# Patient Record
Sex: Male | Born: 1935 | Race: Black or African American | Hispanic: No | Marital: Married | Smoking: Never smoker
Health system: Southern US, Community
[De-identification: ages and names within clinical notes are randomized; demographics above are authoritative.]

## PROBLEM LIST (undated history)

## (undated) DIAGNOSIS — L989 Disorder of the skin and subcutaneous tissue, unspecified: Secondary | ICD-10-CM

## (undated) DIAGNOSIS — H269 Unspecified cataract: Secondary | ICD-10-CM

## (undated) DIAGNOSIS — H35039 Hypertensive retinopathy, unspecified eye: Secondary | ICD-10-CM

## (undated) DIAGNOSIS — I4891 Unspecified atrial fibrillation: Secondary | ICD-10-CM

## (undated) HISTORY — PX: EYE SURGERY: SHX253

## (undated) HISTORY — PX: NECK SURGERY: SHX720

## (undated) HISTORY — PX: CATARACT EXTRACTION: SUR2

## (undated) HISTORY — PX: PROSTATE SURGERY: SHX751

## (undated) HISTORY — DX: Unspecified cataract: H26.9

## (undated) HISTORY — DX: Hypertensive retinopathy, unspecified eye: H35.039

---

## 2015-02-08 ENCOUNTER — Encounter (HOSPITAL_BASED_OUTPATIENT_CLINIC_OR_DEPARTMENT_OTHER): Payer: Self-pay | Admitting: *Deleted

## 2015-02-08 ENCOUNTER — Other Ambulatory Visit: Payer: Self-pay | Admitting: Otolaryngology

## 2015-02-08 NOTE — H&P (Signed)
PREOPERATIVE H&P  Chief Complaint: enlarging posterior scalp lesion  HPI: Daniel Nichols is a 81 y.o. male who presents for evaluation of an enlarging posterior scalp lesion that he's had for years. But it's gradually gotten larger and bothers him. On exam he has a soft 4-5 cm mass on the posterior scalp that may represent a lipoma or cyst. He's taken to the OR for excision under local anesthesia.  Past Medical History  Diagnosis Date  . Scalp lesion    Past Surgical History  Procedure Laterality Date  . Prostate surgery    . Neck surgery Left    History   Social History  . Marital Status: Married    Spouse Name: N/A  . Number of Children: N/A  . Years of Education: N/A   Social History Main Topics  . Smoking status: Never Smoker   . Smokeless tobacco: Not on file  . Alcohol Use: No  . Drug Use: No  . Sexual Activity: Not on file   Other Topics Concern  . None   Social History Narrative  . None   History reviewed. No pertinent family history. No Known Allergies Prior to Admission medications   Medication Sig Start Date End Date Taking? Authorizing Provider  Multiple Vitamin (MULTIVITAMIN WITH MINERALS) TABS tablet Take 1 tablet by mouth daily.   Yes Historical Provider, MD     Positive ROS: negative  All other systems have been reviewed and were otherwise negative with the exception of those mentioned in the HPI and as above.  Physical Exam: There were no vitals filed for this visit.  General: Alert, no acute distress Oral: Normal oral mucosa and tonsils Nasal: Clear nasal passages Neck: No palpable adenopathy or thyroid nodules. Soft posterior scalp lesion 4-5 cm in size. Ear: Ear canal is clear with normal appearing TMs Cardiovascular: Regular rate and rhythm, no murmur.  Respiratory: Clear to auscultation Neurologic: Alert and oriented x 3   Assessment/Plan: SCALP LESION  Plan for Procedure(s): EXCISION SCALP LESION    (MINOR ROOM)    Melony Overly, MD 02/08/2015 4:30 PM

## 2015-02-12 ENCOUNTER — Encounter (HOSPITAL_BASED_OUTPATIENT_CLINIC_OR_DEPARTMENT_OTHER): Payer: Self-pay | Admitting: *Deleted

## 2015-02-12 ENCOUNTER — Ambulatory Visit (HOSPITAL_BASED_OUTPATIENT_CLINIC_OR_DEPARTMENT_OTHER)
Admission: RE | Admit: 2015-02-12 | Discharge: 2015-02-12 | Disposition: A | Discharge status: Home / Self Care | Payer: Medicare Other | Source: Ambulatory Visit | Attending: Otolaryngology | Admitting: Otolaryngology

## 2015-02-12 ENCOUNTER — Encounter (HOSPITAL_BASED_OUTPATIENT_CLINIC_OR_DEPARTMENT_OTHER)
Admission: RE | Disposition: A | Discharge status: Home / Self Care | Payer: Self-pay | Source: Ambulatory Visit | Attending: Otolaryngology

## 2015-02-12 DIAGNOSIS — Z79899 Other long term (current) drug therapy: Secondary | ICD-10-CM | POA: Insufficient documentation

## 2015-02-12 DIAGNOSIS — D170 Benign lipomatous neoplasm of skin and subcutaneous tissue of head, face and neck: Secondary | ICD-10-CM | POA: Diagnosis not present

## 2015-02-12 DIAGNOSIS — L988 Other specified disorders of the skin and subcutaneous tissue: Secondary | ICD-10-CM | POA: Diagnosis present

## 2015-02-12 HISTORY — DX: Disorder of the skin and subcutaneous tissue, unspecified: L98.9

## 2015-02-12 HISTORY — PX: LESION REMOVAL: SHX5196

## 2015-02-12 SURGERY — MINOR EXCISION OF LESION
Anesthesia: LOCAL | Site: Scalp

## 2015-02-12 MED ORDER — BACITRACIN ZINC 500 UNIT/GM EX OINT
TOPICAL_OINTMENT | CUTANEOUS | Status: AC
  Filled 2015-02-12: qty 0.9

## 2015-02-12 MED ORDER — LIDOCAINE-EPINEPHRINE 1 %-1:100000 IJ SOLN
INTRAMUSCULAR | Status: AC
  Filled 2015-02-12: qty 1

## 2015-02-12 MED ORDER — BACITRACIN ZINC 500 UNIT/GM EX OINT
TOPICAL_OINTMENT | CUTANEOUS | Status: AC
  Filled 2015-02-12: qty 28.35

## 2015-02-12 MED ORDER — CEPHALEXIN 500 MG PO CAPS: 500.0000 mg | ORAL_CAPSULE | Freq: Two times a day (BID) | ORAL | Status: DC

## 2015-02-12 MED ORDER — HYDROCODONE-ACETAMINOPHEN 5-325 MG PO TABS: 1.0000 | ORAL_TABLET | ORAL | Status: DC | PRN

## 2015-02-12 MED ORDER — BACITRACIN ZINC 500 UNIT/GM EX OINT
TOPICAL_OINTMENT | CUTANEOUS | Status: DC | PRN
  Administered 2015-02-12: 1 via TOPICAL

## 2015-02-12 MED ORDER — LIDOCAINE-EPINEPHRINE 1 %-1:100000 IJ SOLN
INTRAMUSCULAR | Status: DC | PRN
  Administered 2015-02-12: 9 mL

## 2015-02-12 SURGICAL SUPPLY — 53 items
BANDAGE ADH SHEER 1  50/CT (GAUZE/BANDAGES/DRESSINGS) IMPLANT
BENZOIN TINCTURE PRP APPL 2/3 (GAUZE/BANDAGES/DRESSINGS) IMPLANT
BLADE CLIPPER SURG (BLADE) ×2 IMPLANT
BLADE SURG 15 STRL LF DISP TIS (BLADE) ×1 IMPLANT
BLADE SURG 15 STRL SS (BLADE) ×1
BNDG CONFORM 3 STRL LF (GAUZE/BANDAGES/DRESSINGS) ×2 IMPLANT
BNDG GAUZE ELAST 4 BULKY (GAUZE/BANDAGES/DRESSINGS) ×2 IMPLANT
CANISTER SUCT 1200ML W/VALVE (MISCELLANEOUS) ×2 IMPLANT
CAUTERY EYE LOW TEMP 1300F FIN (OPHTHALMIC RELATED) IMPLANT
CLEANER CAUTERY TIP 5X5 PAD (MISCELLANEOUS) IMPLANT
COTTONBALL LRG STERILE PKG (GAUZE/BANDAGES/DRESSINGS) IMPLANT
DECANTER SPIKE VIAL GLASS SM (MISCELLANEOUS) IMPLANT
DRSG TEGADERM 4X4.75 (GAUZE/BANDAGES/DRESSINGS) IMPLANT
ELECT COATED BLADE 2.86 ST (ELECTRODE) ×2 IMPLANT
ELECT REM PT RETURN 9FT ADLT (ELECTROSURGICAL) ×2
ELECTRODE REM PT RTRN 9FT ADLT (ELECTROSURGICAL) ×1 IMPLANT
GAUZE SPONGE 4X4 16PLY XRAY LF (GAUZE/BANDAGES/DRESSINGS) ×2 IMPLANT
GLOVE BIO SURGEON STRL SZ7 (GLOVE) ×2 IMPLANT
GLOVE SS BIOGEL STRL SZ 7.5 (GLOVE) ×1 IMPLANT
GLOVE SUPERSENSE BIOGEL SZ 7.5 (GLOVE) ×1
GOWN STRL REUS W/ TWL LRG LVL3 (GOWN DISPOSABLE) IMPLANT
GOWN STRL REUS W/TWL LRG LVL3 (GOWN DISPOSABLE)
LIQUID BAND (GAUZE/BANDAGES/DRESSINGS) IMPLANT
MARKER SKIN DUAL TIP RULER LAB (MISCELLANEOUS) ×2 IMPLANT
NEEDLE PRECISIONGLIDE 27X1.5 (NEEDLE) ×2 IMPLANT
NS IRRIG 1000ML POUR BTL (IV SOLUTION) IMPLANT
PACK BASIN DAY SURGERY FS (CUSTOM PROCEDURE TRAY) IMPLANT
PAD CLEANER CAUTERY TIP 5X5 (MISCELLANEOUS)
PENCIL BUTTON HOLSTER BLD 10FT (ELECTRODE) ×2 IMPLANT
SPONGE GAUZE 4X4 12PLY STER LF (GAUZE/BANDAGES/DRESSINGS) ×4 IMPLANT
SPONGE INTESTINAL PEANUT (DISPOSABLE) IMPLANT
STRIP CLOSURE SKIN 1/2X4 (GAUZE/BANDAGES/DRESSINGS) IMPLANT
STRIP CLOSURE SKIN 1/4X4 (GAUZE/BANDAGES/DRESSINGS) IMPLANT
SUCTION FRAZIER TIP 10 FR DISP (SUCTIONS) IMPLANT
SUT CHROMIC 3 0 PS 2 (SUTURE) IMPLANT
SUT CHROMIC 4 0 P 3 18 (SUTURE) IMPLANT
SUT ETHILON 4 0 PS 2 18 (SUTURE) ×2 IMPLANT
SUT ETHILON 5 0 P 3 18 (SUTURE)
SUT ETHILON 6 0 P 1 (SUTURE) IMPLANT
SUT NYLON ETHILON 5-0 P-3 1X18 (SUTURE) IMPLANT
SUT PLAIN 5 0 P 3 18 (SUTURE) IMPLANT
SUT SILK 4 0 TIES 17X18 (SUTURE) IMPLANT
SUT VIC AB 4-0 P-3 18XBRD (SUTURE) ×1 IMPLANT
SUT VIC AB 4-0 P3 18 (SUTURE) ×1
SWAB COLLECTION DEVICE MRSA (MISCELLANEOUS) IMPLANT
SWABSTICK POVIDONE IODINE SNGL (MISCELLANEOUS) IMPLANT
SYR BULB 3OZ (MISCELLANEOUS) IMPLANT
SYR CONTROL 10ML LL (SYRINGE) ×2 IMPLANT
TOWEL OR 17X24 6PK STRL BLUE (TOWEL DISPOSABLE) ×2 IMPLANT
TRAY DSU PREP LF (CUSTOM PROCEDURE TRAY) ×2 IMPLANT
TUBE ANAEROBIC SPECIMEN COL (MISCELLANEOUS) IMPLANT
TUBE CONNECTING 20X1/4 (TUBING) ×2 IMPLANT
YANKAUER SUCT BULB TIP NO VENT (SUCTIONS) IMPLANT

## 2015-02-12 NOTE — Interval H&P Note (Signed)
History and Physical Interval Note:  02/12/2015 8:25 AM  Daniel Nichols  has presented today for surgery, with the diagnosis of SCALP LESION   The various methods of treatment have been discussed with the patient and family. After consideration of risks, benefits and other options for treatment, the patient has consented to  Procedure(s): EXCISION SCALP LESION    (MINOR ROOM)  (N/A) as a surgical intervention .  The patient's history has been reviewed, patient examined, no change in status, stable for surgery.  I have reviewed the patient's chart and labs.  Questions were answered to the patient's satisfaction.     NEWMAN, CHRISTOPHER

## 2015-02-12 NOTE — Discharge Instructions (Signed)
Take your regular meds Tylenol, motrin or hydrocodone prn pain Remove the head dressing tomorrow am and OK to wash hair Call Dr Pollie Friar office for follow up appt in 7-8 days   [phone]

## 2015-02-12 NOTE — Brief Op Note (Signed)
02/12/2015  11:40 AM  PATIENT:  Daniel Nichols  81 y.o. male  PRE-OPERATIVE DIAGNOSIS:  SCALP LESION   POST-OPERATIVE DIAGNOSIS:  Posterior scalp lesion  PROCEDURE:  Procedure(s) with comments: EXCISION SCALP LESION    (MINOR ROOM)  (N/A) - Posterior scalp  SURGEON:  Surgeon(s) and Role:    * Rozetta Nunnery, MD - Primary  PHYSICIAN ASSISTANT:   ASSISTANTS: none   ANESTHESIA:   local  EBL:     BLOOD ADMINISTERED:none  DRAINS: none   LOCAL MEDICATIONS USED:  LIDOCAINE with EPI  9 cc  SPECIMEN:  Source of Specimen:  posterior scalp  DISPOSITION OF SPECIMEN:  PATHOLOGY  COUNTS:  YES  TOURNIQUET:  * No tourniquets in log *  DICTATION: .Other Dictation: Dictation Number [id number]  PLAN OF CARE: Discharge to home after PACU  PATIENT DISPOSITION:  PACU - hemodynamically stable.   Delay start of Pharmacological VTE agent (>24hrs) due to surgical blood loss or risk of bleeding: not applicable

## 2015-02-13 ENCOUNTER — Encounter (HOSPITAL_BASED_OUTPATIENT_CLINIC_OR_DEPARTMENT_OTHER): Payer: Self-pay | Admitting: Otolaryngology

## 2015-02-13 NOTE — Op Note (Signed)
NAMEASHDON, GILLSON NO.:  [id number]  MEDICAL RECORD NO.:  [id number]  LOCATION:                               FACILITY:  [facility]  PHYSICIAN:  Leonides Sake. Lucia Gaskins, M.D.DATE OF BIRTH:  1927-11-20  DATE OF PROCEDURE:  02/12/2015 DATE OF DISCHARGE:  02/12/2015                              OPERATIVE REPORT   PREOPERATIVE DIAGNOSIS:  Enlarging posterior scalp, subcutaneous lesion.  POSTOPERATIVE DIAGNOSIS:  Enlarging posterior scalp, subcutaneous lesion, findings consistent with lipoma.  OPERATION:  Excision of posterior scalp lesion 4 x 5 cm size.  SURGEON:  Leonides Sake. Lucia Gaskins, MD  ANESTHESIA:  Local 1% Xylocaine with epinephrine, 9 mL.  COMPLICATIONS:  None.  BRIEF CLINICAL NOTE:  Daniel Nichols is an 81 year old gentleman who has had a lesion on the posterior scalp now for about 5 years.  He has noticed that it has gradually gotten larger.  On exam, he has a soft about a 4-5 cm subcutaneous mass that is raised about 2-2.5 cm on the posterior right scalp area.  This has bothered him, caused some discomfort recently, and has gradually found to be larger.  He is little bit concerned about the lesion.  On clinical exam, this is more consistent with probable lipoma.  He was taken to the operating room at this time for excision of the posterior scalp lesion.  DESCRIPTION OF PROCEDURE:  The patient was brought to the operating room, placed in a prone position.  The hair over the lesion was shaved. Area was prepped with Betadine solution and draped in a sterile towels. He was then injected with approximately 9 mL of 1% Xylocaine with epinephrine for hemostasis and local anesthetic.  A horizontal incision was made directly over the mass.  Dissection was carried down through the dermis and subcutaneous tissue and then a fatty tumor was encountered that was lying against the periosteum of the bone.  The entire lipoma was removed.  Hemostasis was obtained with  cautery.  Wound was irrigated with saline and closed with 4-0 Vicryl suture subcutaneously and 4-0 nylon to reapproximate the skin edges. Bacitracin ointment and pressure dressing was applied.  The patient tolerated the procedure well with minimal discomfort.  He is discharged home later this morning on Keflex 500 mg b.i.d. for 5 days; Tylenol, Motrin, or Vicodin p.r.n. pain and will follow up in my office in 1 week for recheck and to have sutures removed and review final pathology.    ______________________________ Leonides Sake Lucia Gaskins, M.D.   ______________________________ Leonides Sake. Lucia Gaskins, M.D.    CEN/MEDQ  D:  02/12/2015  T:  02/13/2015  Job:  [id number]

## 2018-05-19 NOTE — Progress Notes (Addendum)
[facility] Note  05/20/2018     CHIEF COMPLAINT Patient presents for Retina Evaluation   HISTORY OF PRESENT ILLNESS: Steele Sizer Sr. is a 84 y.o. male who presents to the clinic today for:   HPI    Retina Evaluation    In left eye.  This started 2 days ago.  Associated Symptoms Distortion.  Negative for Flashes, Pain, Photophobia, Trauma, Jaw Claudication, Fever, Fatigue, Weight Loss, Shoulder/Hip pain, Scalp Tenderness, Glare, Redness, Blind Spot and Floaters.  Context:  distance vision, mid-range vision and near vision.  Response to treatment was no improvement.  I, the attending physician,  performed the HPI with the patient and updated documentation appropriately.          Comments    Patient states sudden loss of vision in what patient thinks is OS but unsure. Top half of vision gone at one point and later left side of vision was affected. This happened about 2 days ago. Vision seems better today. Saw blue triangles in vision last pm. Patient denies nausea, vomiting or headache. Patient denies history of migraines. BP checked with Dr. Jerline Pain yesterday. Reading of 165/98.        Last edited by Bernarda Caffey, MD on 05/20/2018 11:50 AM. (History)    Pt was seen by Dr. Jerline Pain yesterday due to having a sudden problem with VA; Pt states OS VA has been decreasing gradually; Pt states on Saturday (x 6 days) he then began to see floaters OS; Pt states he has poor VA OD due to a cataract; Pt endorses A-Fib, and pacemaker in place; Pt denies any recent changes in heart health; Pt denies ever having heart attack, denies ever having stroke; Pt denies ever having carotid arteries checked; Pt states he has an appointment with cardiologist (Dr. Otho Perl) on Monday and PCP (Dr. Nilda Simmer) on Tuesday of next week;   Referring physician: Shawnie Dapper, DO [address] [place], [place] [address]  HISTORICAL INFORMATION:   Selected notes from the medical  record:  Referred by Dr. Sandre Kitty for concern of CRVO OS LEE: 09.19.19 Marigene Ehlers) [BCVA: OD: CF_0 ' OS: 20/30+2] Ocular Hx-cataract OD, pseudo OS, CRVO OS, HTN ret OU, Bell's Palsy (left side), visual field defect (localized, superior) PMH-heart disease, pacemaker    CURRENT MEDICATIONS: No current outpatient medications on file. (Ophthalmic Drugs)   No current facility-administered medications for this visit.  (Ophthalmic Drugs)   Current Outpatient Medications (Other)  Medication Sig  . metoprolol succinate (TOPROL-XL) 50 MG 24 hr tablet Take 50 mg by mouth daily. Take with or immediately following a meal.  . Multiple Vitamin (MULTIVITAMIN WITH MINERALS) TABS tablet Take 1 tablet by mouth daily.  . cephALEXin (KEFLEX) 500 MG capsule Take 1 capsule (500 mg total) by mouth 2 (two) times daily. (Patient not taking: Reported on 05/20/2018)  . Ginkgo Biloba 40 MG TABS Take by mouth.  Marland Kitchen HYDROcodone-acetaminophen (NORCO/VICODIN) 5-325 MG per tablet Take 1 tablet by mouth every 4 (four) hours as needed for moderate pain. (Patient not taking: Reported on 05/20/2018)  . MAGNESIUM-OXIDE PO Take by mouth.   No current facility-administered medications for this visit.  (Other)      REVIEW OF SYSTEMS: ROS    Positive for: Skin, Musculoskeletal, HENT, Cardiovascular, Eyes   Negative for: Constitutional, Gastrointestinal, Neurological, Genitourinary, Endocrine, Respiratory, Psychiatric, Allergic/Imm, Heme/Lymph   Last edited by Roselee Nova D on 05/20/2018  9:50 AM. (History)  ALLERGIES Allergies  Allergen Reactions  . Codeine Other (See Comments)  . Levaquin [Levofloxacin In D5w] Other (See Comments)    "muscles get weak"  . Phenergan [Promethazine Hcl] Other (See Comments)    PAST MEDICAL HISTORY Past Medical History:  Diagnosis Date  . Atrial fibrillation ([place])   . Cataract   . Scalp lesion    Past Surgical History:  Procedure Laterality Date  . CATARACT EXTRACTION  Left 2008  . LESION REMOVAL N/A 02/12/2015   Procedure: EXCISION SCALP LESION    (MINOR ROOM) ;  Surgeon: Rozetta Nunnery, MD;  Location: [facility];  Service: ENT;  Laterality: N/A;  Posterior scalp  . NECK SURGERY Left   . PROSTATE SURGERY      FAMILY HISTORY Family History  Problem Relation Age of Onset  . Glaucoma Sister   . Glaucoma Brother     SOCIAL HISTORY Social History   Tobacco Use  . Smoking status: Never Smoker  Substance Use Topics  . Alcohol use: No  . Drug use: No         OPHTHALMIC EXAM:  Base Eye Exam    Visual Acuity (Snellen - Linear)      Right Left   Dist [place] 20/350 20/40 +2   Dist ph [place] NI NI       Tonometry (Tonopen, 10:07 AM)      Right Left   Pressure 20 16       Pupils      Dark Light Shape React APD   Right 3 2 Round Brisk None   Left 2 1 Round Brisk None       Visual Fields      Left Right   Restrictions Total superior nasal deficiency Partial outer superior temporal deficiency  Difficulty evaluating OD       Extraocular Movement      Right Left    Full, Ortho Full, Ortho       Neuro/Psych    Oriented x3:  Yes   Mood/Affect:  Normal       Dilation    Both eyes:  1.0% Mydriacyl, 2.5% Phenylephrine @ 10:07 AM        Slit Lamp and Fundus Exam    Slit Lamp Exam      Right Left   Lids/Lashes Dermatochalasis - upper lid, Telangiectasia, Meibomian gland dysfunction, marginal lesion centrally Dermatochalasis - upper lid, Meibomian gland dysfunction, Telangiectasia   Conjunctiva/Sclera White and quiet White and quiet   Cornea Arcus, 1+ Punctate epithelial erosions Arcus, 2+ Punctate epithelial erosions   Anterior Chamber Narrow angles Deep and quiet   Iris Round and moderately dilated Round and moderately dilated, Transillumination defects at 1200 and 0800   Lens 3-4+ Nuclear sclerosis with brunescence , 3+ Cortical cataract Posterior chamber intraocular lens   Vitreous  Vitreous syneresis, Posterior  vitreous detachment       Fundus Exam      Right Left   Disc Hazy view, Peripapillary atrophy hyperemic; blurred margin   C/D Ratio 0.3    Macula scattered IRH superior macula and along ST arcades Blunted foveal reflex, sectoral retinal whitening nasal to fovea, tortuous vessels, DBH   Vessels hazy view; tortuous Tortuous   Periphery attached; scattered heme superiorly attached; scattered DBH   Poor view in right eye        Refraction    Manifest Refraction      Sphere Cylinder Axis Dist VA   Right -2.25 +3.75 005 <  20/200   Left -0.75 +0.50 013 20/30  Couldn't see letters in [place] OD. Only goes to 20/200. Dark reflex OD, unable to retinoscope.           IMAGING AND PROCEDURES  Imaging and Procedures for _0 @  I-stat troponin, ED     Component Value Flag Ref Range Units Status   Troponin i, poc 0.01    0.00 - 0.08 ng/mL Final   Comment 3             Final   Comment:   Due to the release kinetics of cTnI, a negative result within the first hours of the onset of symptoms does not rule out myocardial infarction with certainty. If myocardial infarction is still suspected, repeat the test at appropriate intervals.         OCT, Retina - OU - Both Eyes       Right Eye Quality was good. Central Foveal Thickness: 429. Progression has no prior data. Findings include abnormal foveal contour, intraretinal fluid, no SRF, epiretinal membrane.   Left Eye Quality was good. Central Foveal Thickness: 277. Progression has no prior data. Findings include intraretinal hyper-reflective material, normal foveal contour, no IRF, no SRF (IRHM nasal to fovea).   Notes *Images captured and stored on drive  Diagnosis / Impression:  OD: Superior BRVO with CME OS: NFP with focal IRHM nasal to fovea - BRAO  Clinical management:  See below  Abbreviations: NFP - Normal foveal profile. CME - cystoid macular edema. PED - pigment epithelial detachment. IRF - intraretinal fluid. SRF -  subretinal fluid. EZ - ellipsoid zone. ERM - epiretinal membrane. ORA - outer retinal atrophy. ORT - outer retinal tubulation. SRHM - subretinal hyper-reflective material         Fluorescein Angiography Optos (Transit OS)       Right Eye   Progression has no prior data. Early phase findings include (No image).   Left Eye   Progression has no prior data. Early phase findings include staining, delayed filling, leakage. Mid/Late phase findings include leakage, staining (Hyperfluorescence of disc and inf temp venule).   Notes Images captured and stored on drive  Impression: OD: no images obtained due to dense cataract OD OS: delayed filling; late hyperfluorescence of disc and inf temporal venule -- BRAO w/ reperfusion; ?early CRVO                  ASSESSMENT/PLAN:    ICD-10-CM   1. 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 Branch retinal vein occlusion of right eye with macular edema H34.8310   3. Retinal edema H35.81 OCT, Retina - OU - Both Eyes  4. Mature cataract H26.9   5. Essential hypertension I10   6. Hypertensive retinopathy of both eyes H35.033 Fluorescein Angiography Optos (Transit OS)  7. Pseudophakia Z96.1     1. BRAO OS - sector of retinal whitening superonasal macula and mild disc edema w/ tortuous vessels OS - pt reports scotoma OS and episodes of amaurosis fugax - OCT shows paracentral acute middle maculopathy (PAMM) nasal to fovea corresponding to area of retinal whitening - ?early CRVO component given tortuous vessels and scattered dot hemes - discussed findings and prognosis - differential includes giant cell arteritis - pt has history of Afib and mitral regurg, followed by Dr. Otho Perl - recommend urgent evaluation in ED for ESR, CRP, and stroke work up w/ carotid dopplers and echocardiogram - F/U Tuesday afternoon  2,3.  BRVO w/ macular edema OD - The natural history of retinal vein  occlusion and macular edema and treatment options including observation, laser photocoagulation, and intravitreal antiVEGF injection with Avastin and Lucentis and Eylea and intravitreal injection of steroids with triamcinolone and Ozurdex and the complications of these procedures including loss of vision, infection, cataract, glaucoma, and retinal detachment were discussed with patient. - Specifically discussed findings from [place] / [place] study regarding patient stabilization with anti-VEGF agents and increased potential for visual improvements.  Also discussed need for frequent follow up and potentially multiple injections given the chronic nature of the disease process - BCVA 15/176, but complicated by mature cataract OD - OCT shows CME - recommend IVA OD but will hold for now until stroke / GCA work up completed - F/U Tuesday afternoon  4. Combined form age related cataract OD- mature  - The symptoms of cataract, surgical options, and treatments and risks were discussed with patient. - discussed diagnosis and progression - visually significant and complicating subacute BRVO OD - will eventually need cataract surgery  5,6. Hypertensive retinopathy OU - discussed importance of tight BP control - discussed relationship of BP to #1 and #2 above - monitor  7. Pseudophakia OS  - s/p CE/IOL OS  - beautiful surgery, doing well  - monitor   Ophthalmic Meds Ordered this visit:  No orders of the defined types were placed in this encounter.      Return in about 4 days (around 05/24/2018) for F/U RVO OS, DFE, OCT.  There are no Patient Instructions on file for this visit.   Explained the diagnoses, plan, and follow up with the patient and they expressed understanding.  Patient expressed understanding of the importance of proper follow up care.   This document serves as a record of services personally performed by Gardiner Sleeper, MD, PhD. It was created on their behalf by Ernest Mallick, OA,  an ophthalmic assistant. The creation of this record is the provider's dictation and/or activities during the visit.    Electronically signed by: Ernest Mallick, OA  09.19.19 4:56 PM    Gardiner Sleeper, M.D., Ph.D. Diseases & Surgery of the Retina and Vitreous [facility]   I have reviewed the above documentation for accuracy and completeness, and I agree with the above. Gardiner Sleeper, M.D., Ph.D. 05/20/18 4:56 PM    Abbreviations: M myopia (nearsighted); A astigmatism; H hyperopia (farsighted); P presbyopia; Mrx spectacle prescription;  CTL contact lenses; OD right eye; OS left eye; OU both eyes  XT exotropia; ET esotropia; PEK punctate epithelial keratitis; PEE punctate epithelial erosions; DES dry eye syndrome; MGD meibomian gland dysfunction; ATs artificial tears; PFAT's preservative free artificial tears; [place] nuclear sclerotic cataract; PSC posterior subcapsular cataract; ERM epi-retinal membrane; PVD posterior vitreous detachment; RD retinal detachment; DM diabetes mellitus; DR diabetic retinopathy; NPDR non-proliferative diabetic retinopathy; PDR proliferative diabetic retinopathy; CSME clinically significant macular edema; DME diabetic macular edema; dbh dot blot hemorrhages; CWS cotton wool spot; POAG primary open angle glaucoma; C/D cup-to-disc ratio; HVF humphrey visual field; GVF goldmann visual field; OCT optical coherence tomography; IOP intraocular pressure; BRVO Branch retinal vein occlusion; CRVO central retinal vein occlusion; CRAO central retinal artery occlusion; BRAO branch retinal artery occlusion; RT retinal tear; SB scleral buckle; PPV pars plana vitrectomy; VH Vitreous hemorrhage; PRP panretinal laser photocoagulation; IVK intravitreal kenalog; VMT vitreomacular traction; MH Macular hole;  NVD neovascularization of the disc; NVE neovascularization elsewhere; AREDS age related eye disease study; ARMD age  related macular degeneration; POAG primary open  angle glaucoma; EBMD epithelial/anterior basement membrane dystrophy; ACIOL anterior chamber intraocular lens; IOL intraocular lens; PCIOL posterior chamber intraocular lens; Phaco/IOL phacoemulsification with intraocular lens placement; [place] photorefractive keratectomy; LASIK laser assisted in situ keratomileusis; HTN hypertension; DM diabetes mellitus; COPD chronic obstructive pulmonary disease

## 2018-05-20 ENCOUNTER — Encounter (HOSPITAL_COMMUNITY): Payer: Self-pay | Admitting: *Deleted

## 2018-05-20 ENCOUNTER — Encounter (INDEPENDENT_AMBULATORY_CARE_PROVIDER_SITE_OTHER): Payer: Self-pay | Admitting: Ophthalmology

## 2018-05-20 ENCOUNTER — Emergency Department (HOSPITAL_COMMUNITY): Payer: Medicare Other

## 2018-05-20 ENCOUNTER — Observation Stay (HOSPITAL_COMMUNITY)
Admission: EM | Admit: 2018-05-20 | Discharge: 2018-05-22 | Disposition: A | Discharge status: Home / Self Care | Payer: Medicare Other | Attending: Emergency Medicine | Admitting: Emergency Medicine

## 2018-05-20 ENCOUNTER — Ambulatory Visit (INDEPENDENT_AMBULATORY_CARE_PROVIDER_SITE_OTHER): Payer: Medicare Other | Admitting: Ophthalmology

## 2018-05-20 ENCOUNTER — Other Ambulatory Visit: Payer: Self-pay

## 2018-05-20 DIAGNOSIS — Z8589 Personal history of malignant neoplasm of other organs and systems: Secondary | ICD-10-CM | POA: Diagnosis not present

## 2018-05-20 DIAGNOSIS — H3581 Retinal edema: Secondary | ICD-10-CM | POA: Diagnosis not present

## 2018-05-20 DIAGNOSIS — I482 Chronic atrial fibrillation: Secondary | ICD-10-CM | POA: Diagnosis not present

## 2018-05-20 DIAGNOSIS — Z961 Presence of intraocular lens: Secondary | ICD-10-CM

## 2018-05-20 DIAGNOSIS — H268 Other specified cataract: Secondary | ICD-10-CM

## 2018-05-20 DIAGNOSIS — H35033 Hypertensive retinopathy, bilateral: Secondary | ICD-10-CM

## 2018-05-20 DIAGNOSIS — H34232 Retinal artery branch occlusion, left eye: Secondary | ICD-10-CM | POA: Diagnosis present

## 2018-05-20 DIAGNOSIS — R29898 Other symptoms and signs involving the musculoskeletal system: Secondary | ICD-10-CM | POA: Diagnosis not present

## 2018-05-20 DIAGNOSIS — I10 Essential (primary) hypertension: Secondary | ICD-10-CM | POA: Insufficient documentation

## 2018-05-20 DIAGNOSIS — H538 Other visual disturbances: Secondary | ICD-10-CM | POA: Diagnosis present

## 2018-05-20 DIAGNOSIS — H34831 Tributary (branch) retinal vein occlusion, right eye, with macular edema: Secondary | ICD-10-CM | POA: Diagnosis not present

## 2018-05-20 DIAGNOSIS — I4821 Permanent atrial fibrillation: Secondary | ICD-10-CM

## 2018-05-20 DIAGNOSIS — H269 Unspecified cataract: Secondary | ICD-10-CM | POA: Diagnosis not present

## 2018-05-20 DIAGNOSIS — I671 Cerebral aneurysm, nonruptured: Secondary | ICD-10-CM | POA: Diagnosis present

## 2018-05-20 DIAGNOSIS — I4891 Unspecified atrial fibrillation: Secondary | ICD-10-CM | POA: Diagnosis present

## 2018-05-20 DIAGNOSIS — Z79899 Other long term (current) drug therapy: Secondary | ICD-10-CM | POA: Diagnosis not present

## 2018-05-20 DIAGNOSIS — H349 Unspecified retinal vascular occlusion: Secondary | ICD-10-CM | POA: Diagnosis not present

## 2018-05-20 HISTORY — DX: Unspecified atrial fibrillation: I48.91

## 2018-05-20 LAB — I-STAT TROPONIN, ED: Troponin i, poc: 0.01 ng/mL (ref 0.00–0.08)

## 2018-05-20 LAB — COMPREHENSIVE METABOLIC PANEL
ALK PHOS: 53 U/L (ref 38–126)
ALT: 13 U/L (ref 0–44)
AST: 22 U/L (ref 15–41)
Albumin: 3.5 g/dL (ref 3.5–5.0)
Anion gap: 9 (ref 5–15)
BUN: 14 mg/dL (ref 8–23)
CALCIUM: 9.2 mg/dL (ref 8.9–10.3)
CO2: 26 mmol/L (ref 22–32)
Chloride: 105 mmol/L (ref 98–111)
Creatinine, Ser: 0.85 mg/dL (ref 0.61–1.24)
GFR calc Af Amer: >60 mL/min (ref >60)
GFR calc non Af Amer: >60 mL/min (ref >60)
Glucose, Bld: 222 mg/dL — ABNORMAL HIGH (ref 70–99)
Potassium: 4.4 mmol/L (ref 3.5–5.1)
SODIUM: 140 mmol/L (ref 135–145)
Total Bilirubin: 1.1 mg/dL (ref 0.3–1.2)
Total Protein: 5.9 g/dL — ABNORMAL LOW (ref 6.5–8.1)

## 2018-05-20 LAB — PROTIME-INR
INR: 1.05
Prothrombin Time: 13.6 seconds (ref 11.4–15.2)

## 2018-05-20 LAB — CBC
HCT: 45.7 % (ref 39.0–52.0)
Hemoglobin: 14.9 g/dL (ref 13.0–17.0)
MCH: 31 pg (ref 26.0–34.0)
MCHC: 32.6 g/dL (ref 30.0–36.0)
MCV: 95.2 fL (ref 78.0–100.0)
PLATELETS: 192 10*3/uL (ref 150–400)
RBC: 4.8 MIL/uL (ref 4.22–5.81)
RDW: 12.9 % (ref 11.5–15.5)
WBC: 5.6 10*3/uL (ref 4.0–10.5)

## 2018-05-20 LAB — DIFFERENTIAL
Abs Immature Granulocytes: 0 10*3/uL (ref 0.0–0.1)
BASOS ABS: 0 10*3/uL (ref 0.0–0.1)
Basophils Relative: 0 %
Eosinophils Absolute: 0 10*3/uL (ref 0.0–0.7)
Eosinophils Relative: 0 %
Immature Granulocytes: 0 %
LYMPHS PCT: 38 %
Lymphs Abs: 2.1 10*3/uL (ref 0.7–4.0)
MONO ABS: 0.5 10*3/uL (ref 0.1–1.0)
Monocytes Relative: 9 %
NEUTROS PCT: 51 %
Neutro Abs: 2.9 10*3/uL (ref 1.7–7.7)

## 2018-05-20 LAB — SEDIMENTATION RATE: Sed Rate: 1 mm/hr (ref 0–16)

## 2018-05-20 LAB — C-REACTIVE PROTEIN: CRP: <0.8 mg/dL (ref <1.0)

## 2018-05-20 LAB — APTT: aPTT: 30 seconds (ref 24–36)

## 2018-05-20 MED ORDER — ASPIRIN 300 MG RE SUPP: 300.0000 mg | Freq: Every day | RECTAL | Status: DC

## 2018-05-20 MED ORDER — SODIUM CHLORIDE 0.9 % IV SOLN
INTRAVENOUS | Status: AC
  Administered 2018-05-21: 01:00:00 via INTRAVENOUS

## 2018-05-20 MED ORDER — SENNOSIDES-DOCUSATE SODIUM 8.6-50 MG PO TABS: 1.0000 | ORAL_TABLET | Freq: Every evening | ORAL | Status: DC | PRN

## 2018-05-20 MED ORDER — ACETAMINOPHEN 650 MG RE SUPP: 650.0000 mg | RECTAL | Status: DC | PRN

## 2018-05-20 MED ORDER — ACETAMINOPHEN 325 MG PO TABS: 650.0000 mg | ORAL_TABLET | ORAL | Status: DC | PRN

## 2018-05-20 MED ORDER — ACETAMINOPHEN 160 MG/5ML PO SOLN: 650.0000 mg | ORAL | Status: DC | PRN

## 2018-05-20 MED ORDER — STROKE: EARLY STAGES OF RECOVERY BOOK
Freq: Once | Status: AC
  Administered 2018-05-21: 01:00:00
  Filled 2018-05-20: qty 1

## 2018-05-20 MED ORDER — LORAZEPAM 1 MG PO TABS: 2.0000 mg | ORAL_TABLET | Freq: Once | ORAL | Status: DC

## 2018-05-20 MED ORDER — METOPROLOL SUCCINATE ER 25 MG PO TB24
25.0000 mg | ORAL_TABLET | Freq: Every day | ORAL | Status: DC
  Administered 2018-05-21 – 2018-05-22 (×2): 25 mg via ORAL
  Filled 2018-05-20 (×2): qty 1

## 2018-05-20 MED ORDER — ASPIRIN 325 MG PO TABS
325.0000 mg | ORAL_TABLET | Freq: Every day | ORAL | Status: DC
  Administered 2018-05-21 – 2018-05-22 (×2): 325 mg via ORAL
  Filled 2018-05-20 (×2): qty 1

## 2018-05-20 MED ORDER — ENOXAPARIN SODIUM 40 MG/0.4ML ~~LOC~~ SOLN
40.0000 mg | SUBCUTANEOUS | Status: DC
  Administered 2018-05-21 (×2): 40 mg via SUBCUTANEOUS
  Filled 2018-05-20 (×2): qty 0.4

## 2018-05-20 NOTE — ED Provider Notes (Signed)
Patient placed in Quick Look pathway, seen and evaluated   Chief Complaint: visual changes  HPI: Daniel Sizer Sr. is a 84 y.o. male who presents to the ED from his ophthalmologist due to visual changes. Patient's reports his eye doctor could not see behind the eye due to swelling.   Pt reports vision changes since Saturday that have been intermittent. Describes it as floating objects. Denies any pain and no neuro deficits are noted.   ROS: Eyes: visual changes  Physical Exam:  BP 136/84   Pulse 83   Temp 97.7 F (36.5 C) (Oral)   Resp 16   Ht 5\' 5"  (1.651 m)   Wt 73.5 kg   SpO2 97%   BMI 26.96 kg/m    Gen: No distress  Neuro: Awake and Alert  Skin: Warm and dry     Initiation of care has begun. The patient has been counseled on the process, plan, and necessity for staying for the completion/evaluation, and the remainder of the medical screening examination    Ashley Murrain, NP 05/20/18 1732    Daleen Bo, MD 05/20/18 2134

## 2018-05-20 NOTE — ED Triage Notes (Signed)
Pt reports vision changes since Saturday that have been intermittent. Describes it as floating objects. Denies any pain and no neuro deficits are noted.

## 2018-05-20 NOTE — ED Provider Notes (Signed)
[facility] EMERGENCY DEPARTMENT Provider Note   CSN: [id number] Arrival date & time: 05/20/18  1450     History   Chief Complaint Chief Complaint  Patient presents with  . Visual Field Change    HPI [employer] Sr[employer] is a 84 y.o. male.  HPI   Patient presents after seeing a retinologist today for vision loss left eye.  Patient loss has been intermittent and present for 3 days.  He has chronic vision loss in the right eye.  Today the retinologist diagnosed him with a left retinal artery occlusion, and a right retinal vein occlusion.  He was sent here for stroke evaluation and to rule out temporal arteritis.  The patient describes sensation of floaters and various colors, in his left eye, for 3 days.  He is unable to specify how often they are there.  He denies headache, fever, chills, nausea, vomiting, cough, shortness of breath, chest pain, difficulty walking.  There are no other known modifying factors.  Past Medical History:  Diagnosis Date  . Atrial fibrillation ([place])   . Cataract   . Scalp lesion     Patient Active Problem List   Diagnosis Date Noted  . 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 CATARACT EXTRACTION Left 2008  . LESION REMOVAL N/A 02/12/2015   Procedure: EXCISION SCALP LESION    (MINOR ROOM) ;  Surgeon: Rozetta Nunnery, MD;  Location: [facility];  Service: ENT;  Laterality: N/A;  Posterior scalp  . NECK SURGERY Left   . PROSTATE SURGERY          Home Medications    Prior to Admission medications   Medication Sig Start Date End Date Taking? Authorizing Provider  METOPROLOL SUCCINATE PO Take 1 tablet by mouth daily.   Yes [provider]  cephALEXin (KEFLEX) 500 MG capsule Take 1 capsule (500 mg total) by mouth 2 (two) times daily. Patient not taking: Reported on 05/20/2018 02/12/15   Rozetta Nunnery, MD    HYDROcodone-acetaminophen (NORCO/VICODIN) 5-325 MG per tablet Take 1 tablet by mouth every 4 (four) hours as needed for moderate pain. Patient not taking: Reported on 05/20/2018 02/12/15   Rozetta Nunnery, MD    Family History Family History  Problem Relation Age of Onset  . Glaucoma Sister   . Glaucoma Brother     Social History Social History   Tobacco Use  . Smoking status: Never Smoker  Substance Use Topics  . Alcohol use: No  . Drug use: No     Allergies   Codeine; Levaquin [levofloxacin in d5w]; and Phenergan [promethazine hcl]   Review of Systems Review of Systems  All other systems reviewed and are negative.    Physical Exam Updated Vital Signs BP (!) 138/102   Pulse 85   Temp 98.5 F (36.9 C)   Resp (!) 21   Ht 5\' 5"  (1.651 m)   Wt 73.5 kg   SpO2 97%   BMI 26.96 kg/m   Physical Exam  Constitutional: He appears well-developed and well-nourished.  HENT:  Head: Normocephalic and atraumatic.  Right Ear: External ear normal.  Left Ear: External ear normal.  Eyes: Pupils are equal, round, and reactive to light. Conjunctivae and EOM are normal.  Neck: Normal range of motion and phonation normal. Neck supple.  Cardiovascular: Normal rate, regular rhythm and normal heart sounds.  Pulmonary/Chest: Effort normal and breath sounds normal.  He exhibits no bony tenderness.  Abdominal: Soft. There is no tenderness.  Musculoskeletal: Normal range of motion.  Normal and symmetric strength bilaterally.  Neurological: He is alert. No cranial nerve deficit or sensory deficit. He exhibits normal muscle tone. Coordination normal.  No dysarthria, aphasia or nystagmus.  No pronator drift.  Skin: Skin is warm, dry and intact.  Psychiatric: He has a normal mood and affect. His behavior is normal.  Nursing note and vitals reviewed.    ED Treatments / Results  Labs (all labs ordered are listed, but only abnormal results are displayed) Labs Reviewed   COMPREHENSIVE METABOLIC PANEL - Abnormal; Notable for the following components:      Result Value   Glucose, Bld 222 (*)    Total Protein 5.9 (*)    All other components within normal limits  PROTIME-INR  APTT  CBC  DIFFERENTIAL  C-REACTIVE PROTEIN  SEDIMENTATION RATE  I-STAT TROPONIN, ED    EKG None  Radiology Mr Brain Wo Contrast (neuro Protocol)  Result Date: 05/20/2018 CLINICAL DATA:  History states patient presents to the ED from his ophthalmologist who could not see behind the eye due to swelling. Patient has floating objects. Concern for papilledema. Visual difficulty. EXAM: MRI HEAD WITHOUT CONTRAST MRA HEAD WITHOUT CONTRAST TECHNIQUE: Multiplanar, multiecho pulse sequences of the brain and surrounding structures were obtained without intravenous contrast. Angiographic images of the head were obtained using MRA technique without contrast. COMPARISON:  MR brain 06/03/2015.  MR brain 03/04/2017. FINDINGS: MRI HEAD FINDINGS Brain: No acute stroke, hemorrhage, mass lesion, or extra-axial fluid. Generalized atrophy. Ventriculomegaly, likely hydrocephalus ex vacuo. T2 and FLAIR hyperintensities throughout the white matter, likely small vessel disease. Vascular: Described separately under MRA head. Skull and upper cervical spine: Normal marrow signal Sinuses/Orbits: Sinuses are clear. LEFT cataract extraction. No orbital masses. 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 No secondary signs of papilledema. Other: None MRA HEAD FINDINGS Both internal carotid arteries are widely patent but dolichoectatic. The basilar artery is patent, mildly hypoplastic due to fetal origin LEFT PCA. RIGHT vertebral dominant. No proximal M1 or M2 MCA stenosis. No visible MCA branch occlusion. Both anterior cerebral arteries are patent and contribute equally to the ACA circulation. There is an abnormal wide necked outpouching from the  LEFT internal carotid artery at the posterior communicating artery origin. It is approximately 4 x 4 mm, too large to classify as an infundibulum, and is consistent with a saccular aneurysm; the posterior communicating artery originates directly the apex of this aneurysm, and becomes a fetal PCA. There is no irregularity or diverticulum to suggest recent hemorrhage. IMPRESSION: MRI brain demonstrating no acute intracranial findings. Atrophy and small vessel disease. MRA intracranial demonstrating an unruptured atypical 4 mm saccular aneurysm, LEFT posterior communicating artery origin, from which the PCom originates at its apex and continues as a fetal LEFT PCA. Continued surveillance is warranted. Electronically Signed   By: Staci Righter M.D.   On: 05/20/2018 18:35   Mr Virgel Paling (cerebral Arteries)  Result Date: 05/20/2018 CLINICAL DATA:  History states patient presents to the ED from his ophthalmologist who could not see behind the eye due to swelling. Patient has floating objects. Concern for papilledema. Visual difficulty. EXAM: MRI HEAD WITHOUT CONTRAST MRA HEAD WITHOUT CONTRAST TECHNIQUE: Multiplanar, multiecho pulse sequences of the brain and surrounding structures were obtained without intravenous contrast. Angiographic images of the head were obtained using MRA technique without contrast. COMPARISON:  MR brain 06/03/2015.  MR brain 03/04/2017. FINDINGS: MRI HEAD FINDINGS Brain: No acute stroke, hemorrhage, mass lesion, or extra-axial fluid. Generalized atrophy. Ventriculomegaly, likely hydrocephalus ex vacuo. T2 and FLAIR hyperintensities throughout the white matter, likely small vessel disease. Vascular: Described separately under MRA head. Skull and upper cervical spine: Normal marrow signal Sinuses/Orbits: Sinuses are clear. LEFT cataract extraction. No orbital masses. 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 No  secondary signs of papilledema. Other: None MRA HEAD FINDINGS Both internal carotid arteries are widely patent but dolichoectatic. The basilar artery is patent, mildly hypoplastic due to fetal origin LEFT PCA. RIGHT vertebral dominant. No proximal M1 or M2 MCA stenosis. No visible MCA branch occlusion. Both anterior cerebral arteries are patent and contribute equally to the ACA circulation. There is an abnormal wide necked outpouching from the LEFT internal carotid artery at the posterior communicating artery origin. It is approximately 4 x 4 mm, too large to classify as an infundibulum, and is consistent with a saccular aneurysm; the posterior communicating artery originates directly the apex of this aneurysm, and becomes a fetal PCA. There is no irregularity or diverticulum to suggest recent hemorrhage. IMPRESSION: MRI brain demonstrating no acute intracranial findings. Atrophy and small vessel disease. MRA intracranial demonstrating an unruptured atypical 4 mm saccular aneurysm, LEFT posterior communicating artery origin, from which the PCom originates at its apex and continues as a fetal LEFT PCA. Continued surveillance is warranted. Electronically Signed   By: Staci Righter M.D.   On: 05/20/2018 18:35    Procedures Procedures (including critical care time)  Medications Ordered in ED Medications - No data to display   Initial Impression / Assessment and Plan / ED Course  I have reviewed the triage vital signs and the nursing notes.  Pertinent labs & imaging results that were available during my care of the patient were reviewed by me and considered in my medical decision making (see chart for details).      Patient Vitals for the past 24 hrs:  BP Temp Temp src Pulse Resp SpO2 Height Weight  05/20/18 2200 (!) 138/102 - - 85 (!) 21 97 % - -  05/20/18 2145 (!) 141/90 - - 80 19 97 % - -  05/20/18 2135 - 98.5 F (36.9 C) - - - - - -  05/20/18 2130 (!) 151/97 - - 77 20 97 % - -  05/20/18 2001  (!) 126/104 98.6 F (37 C) Oral 81 20 96 % - -  05/20/18 2000 (!) 126/104 98.6 F (37 C) Oral 81 20 - - -  05/20/18 1834 (!) 142/97 98.3 F (36.8 C) Oral 73 16 97 % - -  05/20/18 1703 136/84 97.7 F (36.5 C) Oral 83 16 97 % - -  05/20/18 1547 134/63 98.4 F (36.9 C) Oral 85 16 97 % 5\' 5"  (1.651 m) 73.5 kg    Medical Decision Making: Left retinal artery occlusion, clinically diagnosed by ophthalmology today in the office.  She was sent here for a stroke evaluation.  MRI brain and MRA head, not reveal acute abnormalities that require intervention.  He has a saccular aneurysm left side that will require ongoing observation.  Vision loss in the left eye is intermittent.  Hospital-based stroke work-up is indicated at this time.  CRITICAL CARE-no Performed by: Daleen Bo   Nursing Notes Reviewed/ Care Coordinated Applicable Imaging Reviewed Interpretation of Laboratory Data incorporated into ED treatment   9:38 PM-Consult  complete with hospitalist. Patient case explained and discussed.  He agrees to admit patient for further evaluation and treatment. Call ended at 10:45 PM  Plan: Admit  Final Clinical Impressions(s) / ED Diagnoses   Final diagnoses:  Retinal artery occlusion    ED Discharge Orders    None       Daleen Bo, MD 05/20/18 2217

## 2018-05-20 NOTE — H&P (Signed)
History and Physical    [employer] Sr[employer] ZCH:[id number] DOB: 1928/07/21 DOA: 05/20/2018  PCP: Delilah Shan, MD   Patient coming from: Home   Chief Complaint: Intermittent vision disturbance  HPI: Daniel [employer] Sr. is a 84 y.o. male with medical history significant for chronic atrial fibrillation not on anticoagulation, and hypertension, now presenting to the emergency department for evaluation of intermittent visual disturbance for the past 3 days.  Patient reports that he was in his usual state of health until 3 days ago when he noted a transient vision change.  He has seen dark and colored spots in his visual field and at one point had the upper half of his visual field gone, and later the left lateral visual field.  Symptoms have come and gone several times, he denies headache, denies change in hearing, and denies any focal numbness or weakness.  No recent fall or trauma.  Denies any chest pain or palpitations.  Believes that it was his left eye that is been affected, but is not certain.  He was evaluated by his ophthalmologist for these complaints earlier today and had a retinal evaluation concerning for BRAO on the left.  He was directed to the ED for further evaluation of this.   ED Course: Upon arrival to the ED, patient is found to be afebrile, saturating well on room air, and with vitals otherwise normal.  CBC and chemistry panel are unremarkable.  EKG features atrial fibrillation with incomplete LBBB.  MRI brain is negative for acute intracranial abnormality and MRA is notable for an unruptured 4 mm saccular aneurysm sed rate was sent from the ED and remains pending.  Patient will be observed in the hospital for ongoing evaluation and management of her artery occlusion.  Review of Systems:  All other systems reviewed and apart from HPI, are negative.  Past Medical History:  Diagnosis Date  . Atrial fibrillation ([place])   . Cataract   . Scalp lesion     Past Surgical History:    Procedure Laterality Date  . CATARACT EXTRACTION Left 2008  . LESION REMOVAL N/A 02/12/2015   Procedure: EXCISION SCALP LESION    (MINOR ROOM) ;  Surgeon: Rozetta Nunnery, MD;  Location: [facility];  Service: ENT;  Laterality: N/A;  Posterior scalp  . NECK SURGERY Left   . PROSTATE SURGERY       reports that he has never smoked. He does not have any smokeless tobacco history on file. He reports that he does not drink alcohol or use drugs.  Allergies  Allergen Reactions  . Codeine Other (See Comments)  . Levaquin [Levofloxacin In D5w] Other (See Comments)    "muscles get weak"  . Phenergan [Promethazine Hcl] Other (See Comments)    Family History  Problem Relation Age of Onset  . Glaucoma Sister   . Glaucoma Brother      Prior to Admission medications   Medication Sig Start Date End Date Taking? Authorizing Provider  METOPROLOL SUCCINATE PO Take 1 tablet by mouth daily.   Yes [provider]    Physical Exam: Vitals:   05/20/18 2130 05/20/18 2135 05/20/18 2145 05/20/18 2200  BP: (!) 151/97  (!) 141/90 (!) 138/102  Pulse: 77  80 85  Resp: 20  19 (!) 21  Temp:  98.5 F (36.9 C)    TempSrc:      SpO2: 97%  97% 97%  Weight:      Height:  Constitutional: NAD, calm  Eyes: PERTLA, lids and conjunctivae normal ENMT: Mucous membranes are moist. Posterior pharynx clear of any exudate or lesions.   Neck: normal, supple, no masses, no thyromegaly Respiratory: clear to auscultation bilaterally, no wheezing, no crackles. Normal respiratory effort.   Cardiovascular: Rate ~80 and irregular. No extremity edema.  Abdomen: No distension, no tenderness, soft. Bowel sounds normal.  Musculoskeletal: no clubbing / cyanosis. No joint deformity upper and lower extremities.   Skin: no significant rashes, lesions, ulcers. Warm, dry, well-perfused. Neurologic: CN 2-12 grossly intact. Sensation intact. Strength 5/5 in all 4 limbs.  Psychiatric: Alert and  oriented x 3. Calm, cooperative.    Labs on Admission: I have personally reviewed following labs and imaging studies  CBC: Recent Labs  Lab 05/20/18 1608  WBC 5.6  NEUTROABS 2.9  HGB 14.9  HCT 45.7  MCV 95.2  PLT 791   Basic Metabolic Panel: Recent Labs  Lab 05/20/18 1608  NA 140  K 4.4  CL 105  CO2 26  GLUCOSE 222*  BUN 14  CREATININE 0.85  CALCIUM 9.2   GFR: Estimated Creatinine Clearance: 52.3 mL/min (by C-G formula based on SCr of 0.85 mg/dL). Liver Function Tests: Recent Labs  Lab 05/20/18 1608  AST 22  ALT 13  ALKPHOS 53  BILITOT 1.1  PROT 5.9*  ALBUMIN 3.5   No results for input(s): LIPASE, AMYLASE in the last 168 hours. No results for input(s): AMMONIA in the last 168 hours. Coagulation Profile: Recent Labs  Lab 05/20/18 1608  INR 1.05   Cardiac Enzymes: No results for input(s): CKTOTAL, CKMB, CKMBINDEX, TROPONINI in the last 168 hours. BNP (last 3 results) No results for input(s): PROBNP in the last 8760 hours. HbA1C: No results for input(s): HGBA1C in the last 72 hours. CBG: No results for input(s): GLUCAP in the last 168 hours. Lipid Profile: No results for input(s): CHOL, HDL, LDLCALC, TRIG, CHOLHDL, LDLDIRECT in the last 72 hours. Thyroid Function Tests: No results for input(s): TSH, T4TOTAL, FREET4, T3FREE, THYROIDAB in the last 72 hours. Anemia Panel: No results for input(s): VITAMINB12, FOLATE, FERRITIN, TIBC, IRON, RETICCTPCT in the last 72 hours. Urine analysis: No results found for: COLORURINE, APPEARANCEUR, LABSPEC, PHURINE, GLUCOSEU, HGBUR, BILIRUBINUR, KETONESUR, PROTEINUR, UROBILINOGEN, NITRITE, LEUKOCYTESUR Sepsis Labs: _0 (procalcitonin:4,lacticidven:4) )No results found for this or any previous visit (from the past 240 hour(s)).   Radiological Exams on Admission: Mr Brain Wo Contrast (neuro Protocol)  Result Date: 05/20/2018 CLINICAL DATA:  History states patient presents to the ED from his ophthalmologist who  could not see behind the eye due to swelling. Patient has floating objects. Concern for papilledema. Visual difficulty. EXAM: MRI HEAD WITHOUT CONTRAST MRA HEAD WITHOUT CONTRAST TECHNIQUE: Multiplanar, multiecho pulse sequences of the brain and surrounding structures were obtained without intravenous contrast. Angiographic images of the head were obtained using MRA technique without contrast. COMPARISON:  MR brain 06/03/2015.  MR brain 03/04/2017. FINDINGS: MRI HEAD FINDINGS Brain: No acute stroke, hemorrhage, mass lesion, or extra-axial fluid. Generalized atrophy. Ventriculomegaly, likely hydrocephalus ex vacuo. T2 and FLAIR hyperintensities throughout the white matter, likely small vessel disease. Vascular: Described separately under MRA head. Skull and upper cervical spine: Normal marrow signal Sinuses/Orbits: Sinuses are clear. LEFT cataract extraction. No orbital masses. 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 No secondary signs of papilledema. Other: None MRA HEAD FINDINGS Both internal carotid arteries are widely patent but dolichoectatic. The basilar artery is patent, mildly hypoplastic due  to fetal origin LEFT PCA. RIGHT vertebral dominant. No proximal M1 or M2 MCA stenosis. No visible MCA branch occlusion. Both anterior cerebral arteries are patent and contribute equally to the ACA circulation. There is an abnormal wide necked outpouching from the LEFT internal carotid artery at the posterior communicating artery origin. It is approximately 4 x 4 mm, too large to classify as an infundibulum, and is consistent with a saccular aneurysm; the posterior communicating artery originates directly the apex of this aneurysm, and becomes a fetal PCA. There is no irregularity or diverticulum to suggest recent hemorrhage. IMPRESSION: MRI brain demonstrating no acute intracranial findings. Atrophy and small vessel disease. MRA  intracranial demonstrating an unruptured atypical 4 mm saccular aneurysm, LEFT posterior communicating artery origin, from which the PCom originates at its apex and continues as a fetal LEFT PCA. Continued surveillance is warranted. Electronically Signed   By: Staci Righter M.D.   On: 05/20/2018 18:35   Mr Virgel Paling (cerebral Arteries)  Result Date: 05/20/2018 CLINICAL DATA:  History states patient presents to the ED from his ophthalmologist who could not see behind the eye due to swelling. Patient has floating objects. Concern for papilledema. Visual difficulty. EXAM: MRI HEAD WITHOUT CONTRAST MRA HEAD WITHOUT CONTRAST TECHNIQUE: Multiplanar, multiecho pulse sequences of the brain and surrounding structures were obtained without intravenous contrast. Angiographic images of the head were obtained using MRA technique without contrast. COMPARISON:  MR brain 06/03/2015.  MR brain 03/04/2017. FINDINGS: MRI HEAD FINDINGS Brain: No acute stroke, hemorrhage, mass lesion, or extra-axial fluid. Generalized atrophy. Ventriculomegaly, likely hydrocephalus ex vacuo. T2 and FLAIR hyperintensities throughout the white matter, likely small vessel disease. Vascular: Described separately under MRA head. Skull and upper cervical spine: Normal marrow signal Sinuses/Orbits: Sinuses are clear. LEFT cataract extraction. No orbital masses. 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 No secondary signs of papilledema. Other: None MRA HEAD FINDINGS Both internal carotid arteries are widely patent but dolichoectatic. The basilar artery is patent, mildly hypoplastic due to fetal origin LEFT PCA. RIGHT vertebral dominant. No proximal M1 or M2 MCA stenosis. No visible MCA branch occlusion. Both anterior cerebral arteries are patent and contribute equally to the ACA circulation. There is an abnormal wide necked outpouching from the LEFT internal carotid artery at  the posterior communicating artery origin. It is approximately 4 x 4 mm, too large to classify as an infundibulum, and is consistent with a saccular aneurysm; the posterior communicating artery originates directly the apex of this aneurysm, and becomes a fetal PCA. There is no irregularity or diverticulum to suggest recent hemorrhage. IMPRESSION: MRI brain demonstrating no acute intracranial findings. Atrophy and small vessel disease. MRA intracranial demonstrating an unruptured atypical 4 mm saccular aneurysm, LEFT posterior communicating artery origin, from which the PCom originates at its apex and continues as a fetal LEFT PCA. Continued surveillance is warranted. Electronically Signed   By: Staci Righter M.D.   On: 05/20/2018 18:35    EKG: Independently reviewed. Atrial fibrillation, incomplete LBBB.   Assessment/Plan   1. Branch retinal artery occlusion, left  - Presents with 3 days of intermittent vision disturbances, seen by ophthalmology just prior to arrival in ED, diagnosed with BRAO  - MRI and MRA brain performed in ED and negative for acute intracranial abnormality  - Check ESR for possible GCA, and check echocardiogram and carotid US - Continue cardiac monitoring with frequent neuro checks, start ASA    2. Atrial fibrillation   -  In rate-controlled atrial fibrillation on admission  - CHADS-VASc is 51 (age x2, HTN)  - He follows with [facility] cardiology and continues to decline anticoagulation for stroke-prevention    3. Hypertension  - BP at goal, continue metoprolol   4. Intracranial aneurysm  - MRA head reveals unruptured 4 mm saccular aneurysm arising from left posterior communicating artery  - Continued surveillance recommended    DVT prophylaxis: Lovenox  Code Status: Full  Family Communication: Wife updated at bedside Consults called: None Admission status: Observation     Vianne Bulls, MD [facility] Pager [phone]  If 7PM-7AM, please contact  night-coverage [email or web address] Password [facility]  05/20/2018, 11:24 PM

## 2018-05-21 ENCOUNTER — Observation Stay (HOSPITAL_COMMUNITY): Payer: Medicare Other

## 2018-05-21 ENCOUNTER — Observation Stay (HOSPITAL_BASED_OUTPATIENT_CLINIC_OR_DEPARTMENT_OTHER): Payer: Medicare Other

## 2018-05-21 DIAGNOSIS — H34232 Retinal artery branch occlusion, left eye: Secondary | ICD-10-CM | POA: Diagnosis not present

## 2018-05-21 DIAGNOSIS — H349 Unspecified retinal vascular occlusion: Secondary | ICD-10-CM | POA: Diagnosis not present

## 2018-05-21 DIAGNOSIS — I482 Chronic atrial fibrillation: Secondary | ICD-10-CM | POA: Diagnosis not present

## 2018-05-21 LAB — LIPID PANEL
CHOL/HDL RATIO: 3.6 ratio
Cholesterol: 128 mg/dL (ref 0–200)
HDL: 36 mg/dL — ABNORMAL LOW (ref >40)
LDL Cholesterol: 79 mg/dL (ref 0–99)
Triglycerides: 64 mg/dL (ref <150)
VLDL: 13 mg/dL (ref 0–40)

## 2018-05-21 LAB — ECHOCARDIOGRAM COMPLETE
HEIGHTINCHES: 65 in
Weight: 2440.93 oz

## 2018-05-21 LAB — HEMOGLOBIN A1C
Hgb A1c MFr Bld: 5.9 % — ABNORMAL HIGH (ref 4.8–5.6)
Mean Plasma Glucose: 122.63 mg/dL

## 2018-05-21 NOTE — Progress Notes (Signed)
VASCULAR LAB PRELIMINARY  PRELIMINARY  PRELIMINARY  PRELIMINARY  Carotid duplex completed.    Preliminary report:  1-39% ICA plaquing. Vertebral artery flow is antegrade.   Macee Venables, RVT 05/21/2018, 3:41 PM

## 2018-05-21 NOTE — Evaluation (Signed)
Occupational Therapy Evaluation Patient Details Name: Daniel Gillentine Sr. MRN: [id number] DOB: [birth year] Today's Date: 05/21/2018    History of Present Illness Patient presents after seeing a retinologist today for vision loss left eye.  Patient loss has been intermittent and present for 3 days.  He has chronic vision loss in the right eye.  Today the retinologist diagnosed him with a left retinal artery occlusion, and a right retinal vein occlusion.  He was sent here for stroke evaluation and to rule out temporal arteritis.  The patient describes sensation of floaters and various colors, in his left eye, for 3 days.  MRI neg for acute abnormality. PMH: a fib with partial LBBB.   Clinical Impression   PTA patient independent.  Admitted for above and reports visual changes, that have now resolved.  Currently at baseline, demonstrating ability to complete mobility and ADLs with modified independence.  Visual assessment completed with noted increased time/trials to scan towards L side, but functional, and overshooting with depth perception bilaterally.  He reports cataract on R eye, but otherwise vision has returned to normal with no impacts noted on ADL function.  No further OT needs identified, and OT will sign off.  If further needs arise, please re-consult.     Follow Up Recommendations  No OT follow up    Equipment Recommendations  None recommended by OT    Recommendations for Other Services       Precautions / Restrictions Precautions Precautions: Fall Precaution Comments: has cataract on R and now visual changes on L Restrictions Weight Bearing Restrictions: No      Mobility Bed Mobility               General bed mobility comments: OOB in recliner   Transfers Overall transfer level: Modified independent Equipment used: None                  Balance Overall balance assessment: Modified Independent                                         ADL  either performed or assessed with clinical judgement   ADL Overall ADL's : At baseline                                       General ADL Comments: baseline dressing, bathing and grooming; transfers with modified indpendence     Vision Baseline Vision/History: Wears glasses Wears Glasses: Reading only Patient Visual Report: Other (comment)(resolved floaters and blurry vision, reports baseline ) Vision Assessment?: Yes Eye Alignment: Within Functional Limits Ocular Range of Motion: Within Functional Limits Alignment/Gaze Preference: Within Defined Limits Tracking/Visual Pursuits: Able to track stimulus in all quads without difficulty(increased time to track towards L side) Saccades: Within functional limits Convergence: Within functional limits Visual Fields: No apparent deficits Depth Perception: Overshoots     Perception     Praxis      Pertinent Vitals/Pain Pain Assessment: No/denies pain     Hand Dominance Right   Extremity/Trunk Assessment Upper Extremity Assessment Upper Extremity Assessment: Overall WFL for tasks assessed(tremors L UE)   Lower Extremity Assessment Lower Extremity Assessment: Overall WFL for tasks assessed   Cervical / Trunk Assessment Cervical / Trunk Assessment: Normal   Communication Communication Communication: No difficulties   Cognition Arousal/Alertness: Awake/alert Behavior  During Therapy: WFL for tasks assessed/performed Overall Cognitive Status: Within Functional Limits for tasks assessed                                     General Comments  spouse present and supportive     Exercises     Shoulder Instructions      Home Living Family/patient expects to be discharged to:: Private residence Living Arrangements: Spouse/significant other Available Help at Discharge: Family;Available 24 hours/day Type of Home: House Home Access: Stairs to enter [employer] of Steps: 3   Home Layout:  One level     Bathroom Shower/Tub: Occupational psychologist: Standard     Home Equipment: None          Prior Functioning/Environment Level of Independence: Independent        Comments: independent with ADLs, mobility and driving         OT Problem List: Impaired vision/perception      OT Treatment/Interventions:      OT Goals(Current goals can be found in the care plan section) Acute Rehab OT Goals Patient Stated Goal: return home OT Goal Formulation: With patient/family  OT Frequency:     Barriers to D/C:            Co-evaluation              AM-PAC PT "6 Clicks" Daily Activity     Outcome Measure Help from another person eating meals?: None Help from another person taking care of personal grooming?: None Help from another person toileting, which includes using toliet, bedpan, or urinal?: None Help from another person bathing (including washing, rinsing, drying)?: None Help from another person to put on and taking off regular upper body clothing?: None Help from another person to put on and taking off regular lower body clothing?: None 6 Click Score: 24   End of Session Nurse Communication: Mobility status  Activity Tolerance: Patient tolerated treatment well Patient left: in chair;with nursing/sitter in room  OT Visit Diagnosis: Other (comment);Other symptoms and signs involving the nervous system (R29.898)(visual changes )                Time: 1610-9604 OT Time Calculation (min): 10 min Charges:  OT General Charges $OT Visit: 1 Visit OT Evaluation $OT Eval Low Complexity: [place], OT Acute Rehabilitation Services Pager [phone] Office [phone]   Delight Stare 05/21/2018, 12:13 PM

## 2018-05-21 NOTE — Progress Notes (Signed)
[facility]                                                                              Patient Demographics  Daniel Nichols, is a 84 y.o. male, DOB - 06-May-1928, HYI:[id number]  Admit date - 05/20/2018   Admitting Physician Vianne Bulls, MD  Outpatient Primary MD for the patient is Delilah Shan, MD  Outpatient specialists:   LOS - 0  days    Chief Complaint  Patient presents with  . Visual Field Change       Brief summary   [employer] Sr. is a 84 y.o. male with medical history significant for chronic atrial fibrillation not on anticoagulation, and hypertension, presenting with three-day history of intermittent visual disturbance to ophthalmologist and noted to have left branch retinal artery occlusion, and referred to the ED for further evaluation and admission for stroke workup..   Assessment & Plan    Principal Problem:   BRAO (branch retinal artery occlusion), left Active Problems:   Atrial fibrillation (HCC)   Intracranial aneurysm   1. Branch retinal artery occlusion, left  - Presents with 3 days of intermittent vision disturbances, seen by ophthalmology just prior to arrival in ED, diagnosed with BRAO  - MRI and MRA brain performed in ED and negative for acute intracranial abnormality  - Check ESR, lipid, A1C, echocardiogram and carotid US - Continue cardiac monitoring with frequent neuro checks, start ASA    2. Atrial fibrillation   - In rate-controlled atrial fibrillation on admission  - CHADS-VASc is 74 (age x2, HTN)  - He follows with Daniel Nichols cardiology and continues to decline anticoagulation for stroke-prevention    3. Hypertension  - BP at goal, continue metoprolol   4. 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 aspirin  300 mg Rectal Daily   Or  . aspirin  325 mg Oral Daily  . enoxaparin (LOVENOX) injection  40 mg Subcutaneous Q24H  . metoprolol succinate  25 mg Oral Daily   Continuous Infusions: PRN Meds:.acetaminophen **OR** acetaminophen (TYLENOL) oral liquid 160 mg/5 mL **OR** acetaminophen, senna-docusate   Antibiotics   Anti-infectives (From admission, onward)   None        Subjective:   Daniel Nichols was seen and examined today. No visual changes. Patient denies dizziness, chest pain, shortness of breath, abdominal pain, N/V/D/C, new weakness, numbess, tingling. No acute events overnight.    Objective:   Vitals:   05/20/18 2200 05/20/18 2339 05/21/18 0203 05/21/18 0748  BP: (!) 138/102 (!) 141/110 136/79 (!) 150/93  Pulse: 85 83 69 78  Resp: (!) _0 Temp:  97.7 F (36.5 C) (!)  97.5 F (36.4 C) 97.7 F (36.5 C)  TempSrc:  Oral Oral Oral  SpO2: 97% 98% 95% 97%  Weight:  69.2 kg    Height:  _0  (1.651 m)      Intake/Output Summary (Last 24 hours) at 05/21/2018 1029 Last data filed at 05/21/2018 0910 Gross per 24 hour  Intake 1213.4 ml  Output -  Net 1213.4 ml     Wt Readings from Last 3 Encounters:  05/20/18 69.2 kg  02/08/15 70.3 kg     Exam  General: NAD  HEENT: NCAT,  PERRL,MMM  Neck: SUPPLE, (-) JVD  Cardiovascular: RRR, (-) GALLOP, (-) MURMUR  Respiratory: CTA  Gastrointestinal: SOFT, (-) DISTENSION, BS(+), (_) TENDERNESS  Ext: (-) CYANOSIS, (-) EDEMA  Neuro: A, OX 3  Skin:(-) RASH  Psych:NORMAL AFFECT/MOOD   Data Reviewed:  I have personally reviewed following labs and imaging studies  Micro Results No results found for this or any previous visit (from  the past 240 hour(s)).  Radiology Reports Mr Brain Wo Contrast (neuro Protocol)  Result Date: 05/20/2018 CLINICAL DATA:  History states patient presents to the ED from his ophthalmologist who could not see behind the eye due to swelling. Patient has floating objects. Concern for papilledema. Visual difficulty. EXAM: MRI HEAD WITHOUT CONTRAST MRA HEAD WITHOUT CONTRAST TECHNIQUE: Multiplanar, multiecho pulse sequences of the brain and surrounding structures were obtained without intravenous contrast. Angiographic images of the head were obtained using MRA technique without contrast. COMPARISON:  MR brain 06/03/2015.  MR brain 03/04/2017. FINDINGS: MRI HEAD FINDINGS Brain: No acute stroke, hemorrhage, mass lesion, or extra-axial fluid. Generalized atrophy. Ventriculomegaly, likely hydrocephalus ex vacuo. T2 and FLAIR hyperintensities throughout the white matter, likely small vessel disease. Vascular: Described separately under MRA head. Skull and upper cervical spine: Normal marrow signal Sinuses/Orbits: Sinuses are clear. LEFT cataract extraction. No orbital masses. 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 No secondary signs of papilledema. Other: None MRA HEAD FINDINGS Both internal carotid arteries are widely patent but dolichoectatic. The basilar artery is patent, mildly hypoplastic due to fetal origin LEFT PCA. RIGHT vertebral dominant. No proximal M1 or M2 MCA stenosis. No visible MCA branch occlusion. Both anterior cerebral arteries are patent and contribute equally to the ACA circulation. There is an abnormal wide necked outpouching from the LEFT internal carotid artery at the posterior communicating artery origin. It is approximately 4 x 4 mm, too large to classify as an infundibulum, and is consistent with a saccular aneurysm; the posterior communicating artery originates directly the apex of this aneurysm, and becomes a  fetal PCA. There is no irregularity or diverticulum to suggest recent hemorrhage. IMPRESSION: MRI brain demonstrating no acute intracranial findings. Atrophy and small vessel disease. MRA intracranial demonstrating an unruptured atypical 4 mm saccular aneurysm, LEFT posterior communicating artery origin, from which the PCom originates at its apex and continues as a fetal LEFT PCA. Continued surveillance is warranted. Electronically Signed   By: Staci Righter M.D.   On: 05/20/2018 18:35   Mr Virgel Paling (cerebral Arteries)  Result Date: 05/20/2018 CLINICAL DATA:  History states patient presents to the ED from his ophthalmologist who could not see behind the eye due to swelling. Patient has floating objects. Concern for papilledema. Visual difficulty. EXAM: MRI HEAD WITHOUT CONTRAST MRA HEAD WITHOUT CONTRAST TECHNIQUE: Multiplanar, multiecho pulse sequences of the brain and surrounding structures were obtained without intravenous contrast. Angiographic images of the head were obtained using  MRA technique without contrast. COMPARISON:  MR brain 06/03/2015.  MR brain 03/04/2017. FINDINGS: MRI HEAD FINDINGS Brain: No acute stroke, hemorrhage, mass lesion, or extra-axial fluid. Generalized atrophy. Ventriculomegaly, likely hydrocephalus ex vacuo. T2 and FLAIR hyperintensities throughout the white matter, likely small vessel disease. Vascular: Described separately under MRA head. Skull and upper cervical spine: Normal marrow signal Sinuses/Orbits: Sinuses are clear. LEFT cataract extraction. No orbital masses. 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 No secondary signs of papilledema. Other: None MRA HEAD FINDINGS Both internal carotid arteries are widely patent but dolichoectatic. The basilar artery is patent, mildly hypoplastic due to fetal origin LEFT PCA. RIGHT vertebral dominant. No proximal M1 or M2 MCA stenosis. No visible MCA branch  occlusion. Both anterior cerebral arteries are patent and contribute equally to the ACA circulation. There is an abnormal wide necked outpouching from the LEFT internal carotid artery at the posterior communicating artery origin. It is approximately 4 x 4 mm, too large to classify as an infundibulum, and is consistent with a saccular aneurysm; the posterior communicating artery originates directly the apex of this aneurysm, and becomes a fetal PCA. There is no irregularity or diverticulum to suggest recent hemorrhage. IMPRESSION: MRI brain demonstrating no acute intracranial findings. Atrophy and small vessel disease. MRA intracranial demonstrating an unruptured atypical 4 mm saccular aneurysm, LEFT posterior communicating artery origin, from which the PCom originates at its apex and continues as a fetal LEFT PCA. Continued surveillance is warranted. Electronically Signed   By: Staci Righter M.D.   On: 05/20/2018 18:35    Lab Data:  CBC: Recent Labs  Lab 05/20/18 1608  WBC 5.6  NEUTROABS 2.9  HGB 14.9  HCT 45.7  MCV 95.2  PLT 277   Basic Metabolic Panel: Recent Labs  Lab 05/20/18 1608  NA 140  K 4.4  CL 105  CO2 26  GLUCOSE 222*  BUN 14  CREATININE 0.85  CALCIUM 9.2   GFR: Estimated Creatinine Clearance: 52.3 mL/min (by C-G formula based on SCr of 0.85 mg/dL). Liver Function Tests: Recent Labs  Lab 05/20/18 1608  AST 22  ALT 13  ALKPHOS 53  BILITOT 1.1  PROT 5.9*  ALBUMIN 3.5   No results for input(s): LIPASE, AMYLASE in the last 168 hours. No results for input(s): AMMONIA in the last 168 hours. Coagulation Profile: Recent Labs  Lab 05/20/18 1608  INR 1.05   Cardiac Enzymes: No results for input(s): CKTOTAL, CKMB, CKMBINDEX, TROPONINI in the last 168 hours. BNP (last 3 results) No results for input(s): PROBNP in the last 8760 hours. HbA1C: Recent Labs    05/21/18 0635  HGBA1C 5.9*   CBG: No results for input(s): GLUCAP in the last 168 hours. Lipid  Profile: No results for input(s): CHOL, HDL, LDLCALC, TRIG, CHOLHDL, LDLDIRECT in the last 72 hours. Thyroid Function Tests: No results for input(s): TSH, T4TOTAL, FREET4, T3FREE, THYROIDAB in the last 72 hours. Anemia Panel: No results for input(s): VITAMINB12, FOLATE, FERRITIN, TIBC, IRON, RETICCTPCT in the last 72 hours. Urine analysis: No results found for: COLORURINE, APPEARANCEUR, LABSPEC, PHURINE, GLUCOSEU, HGBUR, BILIRUBINUR, KETONESUR, PROTEINUR, UROBILINOGEN, NITRITE, Milus Height M.D. [facility] 05/21/2018, 10:29 AM  Pager: [phone] Between 7am to 7pm - call Pager - [phone]  After 7pm go to [email or web address] - password [email or web address]  Call night coverage person covering after 7pm

## 2018-05-21 NOTE — Progress Notes (Signed)
  Echocardiogram 2D Echocardiogram has been performed.  Jennette Dubin 05/21/2018, 3:24 PM

## 2018-05-21 NOTE — Evaluation (Signed)
Physical Therapy Evaluation Patient Details Name: Daniel Dolney Sr. MRN: [id number] DOB: 1928/04/06 Today's Date: 05/21/2018   History of Present Illness  Patient presents after seeing a retinologist today for vision loss left eye.  Patient loss has been intermittent and present for 3 days.  He has chronic vision loss in the right eye.  Today the retinologist diagnosed him with a left retinal artery occlusion, and a right retinal vein occlusion.  He was sent here for stroke evaluation and to rule out temporal arteritis.  The patient describes sensation of floaters and various colors, in his left eye, for 3 days.  MRI neg for acute abnormality. PMH: a fib with partial LBBB.  Clinical Impression  Patient evaluated by Physical Therapy with no further acute PT needs identified. All education has been completed and the patient has no further questions. Pt not having floaters today, occasionally hugs L wall objects too closely but otherwise no gait abnormalities noted. Was able to ascend flight of stairs with no difficulty though HR up to 156 bpm and pt in a-fib throughout session.  See below for any follow-up Physical Therapy or equipment needs. PT is signing off. Thank you for this referral.     Follow Up Recommendations No PT follow up    Equipment Recommendations  None recommended by PT    Recommendations for Other Services       Precautions / Restrictions Precautions Precautions: Fall Precaution Comments: has cataract on R and now visual changes on L Restrictions Weight Bearing Restrictions: No      Mobility  Bed Mobility               General bed mobility comments: pt was OOB in chair  Transfers Overall transfer level: Modified independent Equipment used: None                Ambulation/Gait Ambulation/Gait assistance: Modified independent (Device/Increase time) Gait Distance (Feet): 250 Feet Assistive device: None Gait Pattern/deviations: Staggering left Gait  velocity: WFL Gait velocity interpretation: >4.37 ft/sec, indicative of normal walking speed General Gait Details: pt occasionally too close to objects on L side and mild balance deficits noted with quick turns but no LOB  Stairs Stairs: Yes Stairs assistance: Supervision Stair Management: One rail Right;Alternating pattern;Forwards Number of Stairs: 10 General stair comments: HR 156 bpm at top of stairs and 2/4 DOE.   Wheelchair Mobility    Modified Rankin (Stroke Patients Only) Modified Rankin (Stroke Patients Only) Pre-Morbid Rankin Score: No significant disability Modified Rankin: No significant disability     Balance Overall balance assessment: Modified Independent                                           Pertinent Vitals/Pain Pain Assessment: No/denies pain    Home Living Family/patient expects to be discharged to:: Private residence Living Arrangements: Spouse/significant other Available Help at Discharge: Family;Available 24 hours/day Type of Home: House Home Access: Stairs to enter   [employer] of Steps: 3 Home Layout: One level Home Equipment: None      Prior Function Level of Independence: Independent         Comments: pt just built a deck last week, drives, very active. Of note, his wife reports that he had an episode of dizziness about a week ago that resolved on its own with rest     Hand Dominance  Extremity/Trunk Assessment   Upper Extremity Assessment Upper Extremity Assessment: Defer to OT evaluation    Lower Extremity Assessment Lower Extremity Assessment: Overall WFL for tasks assessed    Cervical / Trunk Assessment Cervical / Trunk Assessment: Normal  Communication   Communication: No difficulties  Cognition Arousal/Alertness: Awake/alert Behavior During Therapy: WFL for tasks assessed/performed Overall Cognitive Status: Within Functional Limits for tasks assessed                                         General Comments General comments (skin integrity, edema, etc.): pt not having floaters today    Exercises     Assessment/Plan    PT Assessment Patent does not need any further PT services  PT Problem List         PT Treatment Interventions      PT Goals (Current goals can be found in the Care Plan section)  Acute Rehab PT Goals Patient Stated Goal: return home    Frequency     Barriers to discharge        Co-evaluation               AM-PAC PT "6 Clicks" Daily Activity  Outcome Measure Difficulty turning over in bed (including adjusting bedclothes, sheets and blankets)?: None Difficulty moving from lying on back to sitting on the side of the bed? : None Difficulty sitting down on and standing up from a chair with arms (e.g., wheelchair, bedside commode, etc,.)?: None Help needed moving to and from a bed to chair (including a wheelchair)?: None Help needed walking in hospital room?: None Help needed climbing 3-5 steps with a railing? : None 6 Click Score: 24    End of Session Equipment Utilized During Treatment: Gait belt Activity Tolerance: Patient tolerated treatment well Patient left: in chair;with call bell/phone within reach;with family/visitor present Nurse Communication: Mobility status PT Visit Diagnosis: Other abnormalities of gait and mobility (R26.89)    Time: 2800-3491 PT Time Calculation (min) (ACUTE ONLY): 25 min   Charges:   PT Evaluation $PT Eval Moderate Complexity: 1 Mod PT Treatments $Gait Training: 8-22 mins        Leighton Roach, [place]  Pager [phone] Office [place] 05/21/2018, 10:45 AM

## 2018-05-22 DIAGNOSIS — H349 Unspecified retinal vascular occlusion: Secondary | ICD-10-CM | POA: Diagnosis not present

## 2018-05-22 DIAGNOSIS — H34232 Retinal artery branch occlusion, left eye: Secondary | ICD-10-CM | POA: Diagnosis not present

## 2018-05-22 DIAGNOSIS — I482 Chronic atrial fibrillation: Secondary | ICD-10-CM | POA: Diagnosis not present

## 2018-05-22 MED ORDER — APIXABAN 5 MG PO TABS: 5.0000 mg | ORAL_TABLET | Freq: Two times a day (BID) | ORAL | Status: DC

## 2018-05-22 MED ORDER — INSULIN ASPART 100 UNIT/ML ~~LOC~~ SOLN: 2.0000 [IU] | Freq: Once | SUBCUTANEOUS | Status: DC

## 2018-05-22 MED ORDER — APIXABAN 5 MG PO TABS: 5.0000 mg | ORAL_TABLET | Freq: Two times a day (BID) | ORAL | 0 refills | Status: AC

## 2018-05-22 MED ORDER — APIXABAN 5 MG PO TABS
5.0000 mg | ORAL_TABLET | Freq: Once | ORAL | Status: AC
  Administered 2018-05-22: 5 mg via ORAL
  Filled 2018-05-22: qty 1

## 2018-05-22 MED ORDER — ATORVASTATIN CALCIUM 10 MG PO TABS
20.0000 mg | ORAL_TABLET | Freq: Every day | ORAL | Status: DC
  Administered 2018-05-22: 20 mg via ORAL
  Filled 2018-05-22: qty 2

## 2018-05-22 MED ORDER — ATORVASTATIN CALCIUM 20 MG PO TABS: 20.0000 mg | ORAL_TABLET | Freq: Every day | ORAL | 0 refills | Status: AC

## 2018-05-22 MED ORDER — APIXABAN 2.5 MG PO TABS: 2.5000 mg | ORAL_TABLET | Freq: Two times a day (BID) | ORAL | 0 refills | Status: DC

## 2018-05-22 MED ORDER — ASPIRIN 325 MG PO TABS: 325.0000 mg | ORAL_TABLET | Freq: Every day | ORAL | 0 refills | Status: DC

## 2018-05-22 NOTE — Progress Notes (Signed)
Paged Dr Vista Lawman regarding pt's wife concerns over pt being discharged. Pt states he had a "floater" at aproximately 1700 and stated it went away. Pt's wife anxious and tearful asking if he should go home. Awaiting callback from MD.   Edit: MD called to speak with pt's wife. See new orders. Pt to be discharged after receiving one time dose of eliquis for this evening.

## 2018-05-22 NOTE — Care Management Obs Status (Signed)
[facility] NOTIFICATION   Patient Details  Name: Daniel Seibert Sr. MRN: [id number] Date of Birth: [birth year]   Medicare Observation Status Notification Given:  Yes    Carles Collet, RN 05/22/2018, 10:15 AM

## 2018-05-22 NOTE — Consult Note (Addendum)
Referring Physician:  Dr Vista Lawman    Reason for Consult: Visual changes - left branch retinal artery occlusion  HPI: Steele Sizer Sr. is an 84 y.o. male with medical history significant formitral regurgitation, chronic atrial fibrillation (seen by [facility] cardiology - pt. declined anticoagulation), salivary gland cancer s/p surgery in 1979, hypertension and memory loss (seen by [facility] neurology in 2018 pt declined neuropsychological testing or medication) presenting with three-day history of intermittent visual disturbance. 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 He was referred to the ED for further evaluation and admission for stroke workup. Vision is now back to baseline.   Date last known well: Wednesday Sept 18, 2019 Time last known well: unknown  tPA Given: no - late presentation  Past Medical History Past Medical History:  Diagnosis Date  . Atrial fibrillation ([place])   . Cataract   . Scalp lesion     Surgical History Past Surgical History:  Procedure Laterality Date  . CATARACT EXTRACTION Left 2008  . LESION REMOVAL N/A 02/12/2015   Procedure: EXCISION SCALP LESION    (MINOR ROOM) ;  Surgeon: Rozetta Nunnery, MD;  Location: [place];  Service: ENT;  Laterality: N/A;  Posterior scalp  . NECK SURGERY Left   . PROSTATE SURGERY      Family History  Family History  Problem Relation Age of Onset  . Glaucoma Sister   . Glaucoma Brother     Social History:   reports that he has never smoked. He does not have any smokeless tobacco history on file. He reports that he does not drink alcohol or use drugs.  Allergies:  Allergies  Allergen Reactions  . Codeine Other (See Comments)  . Levaquin [Levofloxacin In D5w] Other (See Comments)    "muscles get weak"  . Phenergan [Promethazine Hcl] Other (See Comments)    Home Medications:  Medications Prior to Admission  Medication Sig Dispense Refill  .  metoprolol tartrate (LOPRESSOR) 25 MG tablet Take 25 mg by mouth 2 (two) times daily.      Hospital Medications . aspirin  300 mg Rectal Daily   Or  . aspirin  325 mg Oral Daily  . enoxaparin (LOVENOX) injection  40 mg Subcutaneous Q24H  . metoprolol succinate  25 mg Oral Daily    History obtained from the patient and his wife  General ROS: negative for - chills, fatigue, fever, night sweats, weight gain or weight loss Psychological ROS: negative for - behavioral disorder, hallucinations, memory difficulties, mood swings or suicidal ideation Ophthalmic ROS: negative for - blurry vision, double vision, eye pain or loss of vision ENT ROS: Positive for recent episodes of dizziness Allergy and Immunology ROS: negative for - hives or itchy/watery eyes Hematological and Lymphatic ROS: negative for - bleeding problems, bruising or swollen lymph nodes Endocrine ROS: negative for - galactorrhea, hair pattern changes, polydipsia/polyuria or temperature intolerance Respiratory ROS: Positive for a recent nonproductive cough Cardiovascular ROS: negative for - chest pain, dyspnea on exertion, edema or irregular heartbeat Gastrointestinal ROS: negative for - abdominal pain, diarrhea, hematemesis, nausea/vomiting or stool incontinence Genito-Urinary ROS: negative for - dysuria, hematuria, incontinence or urinary frequency/urgency Musculoskeletal ROS: negative for - joint swelling or muscular weakness Neurological ROS: as noted in HPI Dermatological ROS: negative for rash and skin lesion changes      Physical Examination:  Vitals:   05/21/18 2353 05/22/18 0416 05/22/18 0851 05/22/18 0956  BP: (!) 147/88 (!) 144/94 Marland Kitchen)  156/103 138/87  Pulse: 73 63 75 67  Resp: 18 18 16    Temp: 97.7 F (36.5 C) (!) 97.4 F (36.3 C) 98.1 F (36.7 C)   TempSrc: Oral Oral    SpO2: 100% 98% 100%   Weight:      Height:          Neurologic Examination:  General -very pleasant and alert 84 year old male in  no acute distress Heart -irregularly irregular heart rate with a soft systolic murmur Lungs - Clear to auscultation anteriorly Abdomen - Soft - non tender Extremities - Distal pulses intact - no edema Skin - Warm and dry  Mental Status: Alert, oriented with some cueing, thought content appropriate.  Speech fluent without evidence of aphasia.  Able to follow 3 step commands without difficulty. Cranial Nerves: II: Discs not visualized; left eye visual acuity FC, right eye visual acuity HW, Visual fields grossly normal, pupils equal, round, reactive to light. 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 XII: midline tongue extension Motor: RUE - 5/5    LUE - 5/5 RLE - 5/5    LLE -  5/5 Tone and bulk:normal tone throughout; no atrophy noted Sensory: Light touch intact throughout, bilaterally Deep Tendon Reflexes: 2+ and symmetric throughout Plantars: Right: downgoing   Left: downgoing Cerebellar: normal finger-to-nose except for a significant bilateral essential tremor  Gait: not tested     LABORATORY STUDIES:  Basic Metabolic Panel: Recent Labs  Lab 05/20/18 1608  NA 140  K 4.4  CL 105  CO2 26  GLUCOSE 222*  BUN 14  CREATININE 0.85  CALCIUM 9.2    Liver Function Tests: Recent Labs  Lab 05/20/18 1608  AST 22  ALT 13  ALKPHOS 53  BILITOT 1.1  PROT 5.9*  ALBUMIN 3.5   No results for input(s): LIPASE, AMYLASE in the last 168 hours. No results for input(s): AMMONIA in the last 168 hours.  CBC: Recent Labs  Lab 05/20/18 1608  WBC 5.6  NEUTROABS 2.9  HGB 14.9  HCT 45.7  MCV 95.2  PLT 192    Cardiac Enzymes: No results for input(s): CKTOTAL, CKMB,  CKMBINDEX, TROPONINI in the last 168 hours.  BNP: Invalid input(s): POCBNP  CBG: No results for input(s): GLUCAP in the last 168 hours.  Microbiology:   Coagulation Studies: Recent Labs    05/20/18 1608  LABPROT 13.6  INR 1.05    Urinalysis: No results for input(s): COLORURINE, LABSPEC, PHURINE, GLUCOSEU, HGBUR, BILIRUBINUR, KETONESUR, PROTEINUR, UROBILINOGEN, NITRITE, LEUKOCYTESUR in the last 168 hours.  Invalid input(s): APPERANCEUR  Lipid Panel:     Component Value Date/Time   CHOL 128 05/21/2018 0635   TRIG 64 05/21/2018 0635   HDL 36 (L) 05/21/2018 0635   CHOLHDL 3.6 05/21/2018 0635   VLDL 13 05/21/2018 0635   LDLCALC 79 05/21/2018 0635    HgbA1C:  Lab Results  Component Value Date   HGBA1C 5.9 (H) 05/21/2018    Urine Drug Screen:  No results found for: LABOPIA, COCAINSCRNUR, LABBENZ, AMPHETMU, THCU, LABBARB   Alcohol Level:  No results for input(s): ETH in the last 168 hours.  Miscellaneous labs:  EKG  EKG - atrial fibrillation - ventricular response 72 bpm - see cardiology reading for complete details.  IMAGING:   Mr Virgel Paling (cerebral Arteries) 05/20/2018 IMPRESSION:  MRI brain  demonstrating no acute intracranial findings. Atrophy and small vessel disease.   MRA intracranial  demonstrating an unruptured atypical 4 mm saccular aneurysm, LEFT posterior communicating artery origin, from which the PCom originates at its apex and continues as a fetal LEFT PCA. Continued surveillance is warranted.   Transthoracic Echocardiogram 05/22/2018 Study Conclusions  - Left ventricle: The cavity size was normal. Systolic function was   normal. The estimated ejection fraction was in the range of 50%   to 55%. Wall motion was normal; there were no regional wall   motion abnormalities. - Mitral valve: Cannot exclude a partial flail of the anterior   leaflet although not charcteristic. Suggest tee to further   evaluate. Severe prolapse, involving the  anterior leaflet. There   was moderate regurgitation directed eccentrically and toward the   free wall. - Left atrium: The atrium was severely dilated. - Right atrium: The atrium was moderately dilated. - Pulmonary arteries: Systolic pressure was mildly increased. PA   peak pressure: 38 mm Hg (S).   Carotid Dopplers 05/22/2018 Preliminary report:  1-39% ICA plaquing. Vertebral artery flow is antegrade.    Assessment: 84 y.o. male with PMH of mitral regurgitation, chronic atrial fibrillation declined anticoagulation in the past, salivary gland cancer s/p surgery in 1979, hypertension presenting with three-day history of intermittent visual disturbance on the left.  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  He saw an ophthalmologist and diagnosed with left BRAO. now vision is now back to baseline.  Stroke work-up negative for acute stroke, carotid Doppler no ICA high-grade stenosis. LDL 79 and A1C 5.9. He has still risk factor of A. fib not on anticoagulation.  Discussed with patient and wife, they agreed to be on anticoagulant this time, recommend Eliquis 5mg  twice daily and low-dose statin.  TTE also showed possible partial flail of anterior leaflet of mitral valve.  Patient had cardiology appointment tomorrow in [place], recommend to discuss with cardiologist regarding this finding.  Plan:  Patient and family agreed with Eliquis 5 mill grams twice daily for stroke prevention  Continue low-dose statin for stroke prevention  Follow-up with cardiology tomorrow regarding abnormal TTE findings.  TTE report provided to wife and patient.  Patient and family will work on [employer] for continued Eliquis coverage  Stroke risk factor modification  Continue follow-up with ophthalmology  OK to discharge from neuro standpoint  Neurology will sign off. Please call with questions. Pt will follow up with stroke clinic NP at [facility] in about 4 weeks. Thanks for the  consult.  Rosalin Hawking, MD PhD Stroke Neurology 05/22/2018 4:23 PM

## 2018-05-22 NOTE — Progress Notes (Signed)
Patient and wife provided with 30 day Eliquis card. They state they do not have Rx drug plan. Instructed them to ask pharmacist what the copay would be for next month and call MD on Monday to discuss if it is something they cannot afford.

## 2018-05-22 NOTE — Discharge Summary (Signed)
Daniel Nichols Sr., is a 84 y.o. male  DOB [birth year]  MRN [id number].  Admission date:  05/20/2018  Admitting Physician  Vianne Bulls, MD  Discharge Date:  05/22/2018   Primary MD  Delilah Shan, MD  Recommendations for primary care physician for things to follow:    Cardiology evaluation for TEE as outpatient a    Admission Diagnosis  Retinal artery occlusion [H34.9]   Discharge Diagnosis  Retinal artery occlusion [H34.9]   Principal Problem:   BRAO (branch retinal artery occlusion), left Active Problems:   Atrial fibrillation ([place])   Intracranial aneurysm      Past Medical History:  Diagnosis Date  . Atrial fibrillation ([place])   . Cataract   . Scalp lesion     Past Surgical History:  Procedure Laterality Date  . CATARACT EXTRACTION Left 2008  . LESION REMOVAL N/A 02/12/2015   Procedure: EXCISION SCALP LESION    (MINOR ROOM) ;  Surgeon: Rozetta Nunnery, MD;  Location: [facility];  Service: ENT;  Laterality: N/A;  Posterior scalp  . NECK SURGERY Left   . PROSTATE SURGERY         HPI  from the history and physical done on the day of admission:         Hospital Course:   84 y.o. male with PMH of mitral regurgitation, chronic atrial fibrillation declined anticoagulation in the past, salivary gland cancer s/p surgery in 1979, hypertension presenting with three-day history of intermittent visual disturbance on the left.  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  He saw an ophthalmologist and diagnosed with left BRAO. now vision is now back to baseline.  Stroke work-up negative for acute stroke, carotid Doppler no ICA high-grade stenosis. LDL 79 and A1C 5.9. He has still risk factor of A. fib not on anticoagulation.  Discussed with patient and wife, they agreed to be on anticoagulant this time, recommend Eliquis 5mg  twice daily and low-dose statin.   TTE also showed possible partial flail of anterior leaflet of mitral valve.  Patient had cardiology appointment tomorrow in [place], and he declines intervention at this time till he discuss with his primary cardiologist.  He will follow up with his ophthalmologist, and see neurology stroke clinic in about 4 weeks      Discharge Condition: stable  Follow UP -  Stroke clinic in 4 weeks, primary cardiologist tomorrow      Consults obtained -  Neurology/stroke team  Diet and Activity recommendation:  As advised  Discharge Instructions     Discharge Instructions    Call MD for:  difficulty breathing, headache or visual disturbances   Complete by:  As directed    Call MD for:  extreme fatigue   Complete by:  As directed    Call MD for:  hives   Complete by:  As directed    Call MD for:  persistant dizziness or light-headedness   Complete by:  As directed    Call MD for:  persistant nausea and vomiting   Complete by:  As directed    Call MD for:  redness, tenderness, or signs of infection (pain, swelling, redness, odor or green/yellow discharge around incision site)   Complete by:  As directed    Call MD for:  severe uncontrolled pain   Complete by:  As directed    Call MD for:  temperature >100.4   Complete by:  As directed    Diet - low sodium heart healthy   Complete by:  As directed    Increase activity slowly   Complete by:  As directed         Discharge Medications     Allergies as of 05/22/2018      Reactions   Codeine Other (See Comments)   Levaquin [levofloxacin In D5w] Other (See Comments)   "muscles get weak"   Phenergan [promethazine Hcl] Other (See Comments)      Medication List    TAKE these medications   apixaban 2.5 MG Tabs tablet Commonly known as:  ELIQUIS Take 1 tablet (2.5 mg total) by mouth 2 (two) times daily.   aspirin 325 MG tablet Take 1 tablet (325 mg total) by mouth daily. Start taking on:  05/23/2018   metoprolol tartrate 25 MG  tablet Commonly known as:  LOPRESSOR Take 25 mg by mouth 2 (two) times daily.       Major procedures and Radiology Reports - PLEASE review detailed and final reports for all details, in brief -     Mr Brain Wo Contrast (neuro Protocol)  Result Date: 05/20/2018 CLINICAL DATA:  History states patient presents to the ED from his ophthalmologist who could not see behind the eye due to swelling. Patient has floating objects. Concern for papilledema. Visual difficulty. EXAM: MRI HEAD WITHOUT CONTRAST MRA HEAD WITHOUT CONTRAST TECHNIQUE: Multiplanar, multiecho pulse sequences of the brain and surrounding structures were obtained without intravenous contrast. Angiographic images of the head were obtained using MRA technique without contrast. COMPARISON:  MR brain 06/03/2015.  MR brain 03/04/2017. FINDINGS: MRI HEAD FINDINGS Brain: No acute stroke, hemorrhage, mass lesion, or extra-axial fluid. Generalized atrophy. Ventriculomegaly, likely hydrocephalus ex vacuo. T2 and FLAIR hyperintensities throughout the white matter, likely small vessel disease. Vascular: Described separately under MRA head. Skull and upper cervical spine: Normal marrow signal Sinuses/Orbits: Sinuses are clear. LEFT cataract extraction. No orbital masses. 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 No secondary signs of papilledema. Other: None MRA HEAD FINDINGS Both internal carotid arteries are widely patent but dolichoectatic. The basilar artery is patent, mildly hypoplastic due to fetal origin LEFT PCA. RIGHT vertebral dominant. No proximal M1 or M2 MCA stenosis. No visible MCA branch occlusion. Both anterior cerebral arteries are patent and contribute equally to the ACA circulation. There is an abnormal wide necked outpouching from the LEFT internal carotid artery at the posterior communicating artery origin. It is approximately 4 x 4 mm, too large to  classify as an infundibulum, and is consistent with a saccular aneurysm; the posterior communicating artery originates directly the apex of this aneurysm, and becomes a fetal PCA. There is no irregularity or diverticulum to suggest recent hemorrhage. IMPRESSION: MRI brain demonstrating no acute intracranial findings. Atrophy and small vessel disease. MRA intracranial demonstrating an unruptured atypical 4 mm saccular aneurysm, LEFT posterior communicating artery origin, from which the PCom originates at its apex and continues as a fetal LEFT PCA. Continued surveillance is warranted. Electronically Signed  By: Staci Righter M.D.   On: 05/20/2018 18:35   Mr Virgel Paling (cerebral Arteries)  Result Date: 05/20/2018 CLINICAL DATA:  History states patient presents to the ED from his ophthalmologist who could not see behind the eye due to swelling. Patient has floating objects. Concern for papilledema. Visual difficulty. EXAM: MRI HEAD WITHOUT CONTRAST MRA HEAD WITHOUT CONTRAST TECHNIQUE: Multiplanar, multiecho pulse sequences of the brain and surrounding structures were obtained without intravenous contrast. Angiographic images of the head were obtained using MRA technique without contrast. COMPARISON:  MR brain 06/03/2015.  MR brain 03/04/2017. FINDINGS: MRI HEAD FINDINGS Brain: No acute stroke, hemorrhage, mass lesion, or extra-axial fluid. Generalized atrophy. Ventriculomegaly, likely hydrocephalus ex vacuo. T2 and FLAIR hyperintensities throughout the white matter, likely small vessel disease. Vascular: Described separately under MRA head. Skull and upper cervical spine: Normal marrow signal Sinuses/Orbits: Sinuses are clear. LEFT cataract extraction. No orbital masses. 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 No secondary signs of papilledema. Other: None MRA HEAD FINDINGS Both internal carotid arteries are widely patent but  dolichoectatic. The basilar artery is patent, mildly hypoplastic due to fetal origin LEFT PCA. RIGHT vertebral dominant. No proximal M1 or M2 MCA stenosis. No visible MCA branch occlusion. Both anterior cerebral arteries are patent and contribute equally to the ACA circulation. There is an abnormal wide necked outpouching from the LEFT internal carotid artery at the posterior communicating artery origin. It is approximately 4 x 4 mm, too large to classify as an infundibulum, and is consistent with a saccular aneurysm; the posterior communicating artery originates directly the apex of this aneurysm, and becomes a fetal PCA. There is no irregularity or diverticulum to suggest recent hemorrhage. IMPRESSION: MRI brain demonstrating no acute intracranial findings. Atrophy and small vessel disease. MRA intracranial demonstrating an unruptured atypical 4 mm saccular aneurysm, LEFT posterior communicating artery origin, from which the PCom originates at its apex and continues as a fetal LEFT PCA. Continued surveillance is warranted. Electronically Signed   By: Staci Righter M.D.   On: 05/20/2018 18:35   Result status: Final result                              *[facility]*                         [address], [place] [address]                            [phone]  ------------------------------------------------------------------- Transthoracic Echocardiography  Patient:    Kartik, Fernando MR #:       [id number] Study Date: 05/21/2018 Gender:     M Age:        48 Height:     165.1 cm Weight:     69.2 kg BSA:        1.79 m^2 Pt. Status: Room:       [place], [place], Inpatient  SONOGRAPHER  Mikki Santee  ADMITTING    Opyd,  Timothy S  ORDERING     Opyd, Timothy S  REFERRING    Opyd, Timothy  S  cc:  ------------------------------------------------------------------- LV EF: 50% -   55%  ------------------------------------------------------------------- Indications:      CVA 79.  ------------------------------------------------------------------- History:   PMH:   Atrial fibrillation.  ------------------------------------------------------------------- Study Conclusions  - Left ventricle: The cavity size was normal. Systolic function was   normal. The estimated ejection fraction was in the range of 50%   to 55%. Wall motion was normal; there were no regional wall   motion abnormalities. - Mitral valve: Cannot exclude a partial flail of the anterior   leaflet although not charcteristic. Suggest tee to further   evaluate. Severe prolapse, involving the anterior leaflet. There   was moderate regurgitation directed eccentrically and toward the   free wall. - Left atrium: The atrium was severely dilated. - Right atrium: The atrium was moderately dilated. - Pulmonary arteries: Systolic pressure was mildly increased. PA   peak pressure: 38 mm Hg (S).  ------------------------------------------------------------------- Study data:  No prior study was available for comparison.  Study status:  Routine.  Procedure:  The patient reported no pain pre or post test. Transthoracic echocardiography. Image quality was adequate.  Study completion:  There were no complications. Transthoracic echocardiography.  M-mode, complete 2D, spectral Doppler, and color Doppler.  Birthdate:  Patient birthdate: [birth year].  Age:  Patient is 84 yr old.  Sex:  Gender: male. BMI: 25.4 kg/m^2.  Blood pressure:     115/79  Patient status: Inpatient.  Study date:  Study date: 05/21/2018. Study time: 02:53 PM.  Location:  [facility]cho laboratory.  -------------------------------------------------------------------  ------------------------------------------------------------------- Left ventricle:   The cavity size was normal. Systolic function was normal. The estimated ejection fraction was in the range of 50% to 55%. Wall motion was normal; there were no regional wall motion abnormalities.  ------------------------------------------------------------------- Aortic valve:   Trileaflet; mildly thickened leaflets. Mobility was not restricted.  Doppler:  Transvalvular velocity was within the normal range. There was no stenosis. There was no regurgitation.   ------------------------------------------------------------------- Aorta:  Aortic root: The aortic root was normal in size.  ------------------------------------------------------------------- Mitral valve:  Cannot exclude a partial flail of the anterior leaflet although not charcteristic. Suggest tee to further evaluate.  Mildly thickened leaflets anterior. Mobility was not restricted.  Severe prolapse, involving the anterior leaflet. Doppler:  Transvalvular velocity was within the normal range. There was no evidence for stenosis. There was moderate regurgitation directed eccentrically and toward the free wall.  ------------------------------------------------------------------- Left atrium:  The atrium was severely dilated.  ------------------------------------------------------------------- Right ventricle:  The cavity size was normal. Wall thickness was normal. Systolic function was normal.  ------------------------------------------------------------------- Pulmonic valve:   Not well visualized.  Doppler:  Transvalvular velocity was within the normal range. There was no evidence for stenosis.  ------------------------------------------------------------------- Tricuspid valve:   Structurally normal valve.    Doppler: Transvalvular velocity was within the normal range. There was mild regurgitation.  ------------------------------------------------------------------- Pulmonary artery:   The main pulmonary  artery was normal-sized. Systolic pressure was mildly increased.  ------------------------------------------------------------------- Right atrium:  The atrium was moderately dilated.  ------------------------------------------------------------------- Pericardium:  There was no pericardial effusion.  ------------------------------------------------------------------- Systemic veins: Inferior vena cava: The vessel was normal in size.  ------------------------------------------------------------------- Measurements   Left ventricle                         Value        Reference  LV ID, ED,  PLAX chordal        (H)     53    mm     43 - 52  LV ID, ES, PLAX chordal                37    mm     23 - 38  LV fx shortening, PLAX chordal         30    %      >=29  LV PW thickness, ED                    10    mm     ----------  IVS/LV PW ratio, ED                    1.1          <=1.3  Stroke volume, 2D                      43    ml     ----------  Stroke volume/bsa, 2D                  24    ml/m^2 ----------    Ventricular septum                     Value        Reference  IVS thickness, ED                      11    mm     ----------    LVOT                                   Value        Reference  LVOT ID, S                             22    mm     ----------  LVOT area                              3.8   cm^2   ----------  LVOT peak velocity, S                  58.4  cm/s   ----------  LVOT mean velocity, S                  40    cm/s   ----------  LVOT VTI, S                            11.2  cm     ----------    Aorta                                  Value        Reference  Aortic root ID, ED                     33    mm     ----------    Left atrium  Value        Reference  LA ID, A-P, ES                         48    mm     ----------  LA ID/bsa, A-P                 (H)     2.68  cm/m^2 <=2.2  LA volume, ES, 1-p A4C                 119   ml      ----------  LA volume/bsa, ES, 1-p A4C             66.4  ml/m^2 ----------  LA volume, ES, 1-p A2C                 120   ml     ----------  LA volume/bsa, ES, 1-p A2C             66.9  ml/m^2 ----------    Pulmonary arteries                     Value        Reference  PA pressure, S, DP             (H)     38    mm Hg  <=30    Tricuspid valve                        Value        Reference  Tricuspid regurg peak velocity         296   cm/s   ----------  Tricuspid peak RV-RA gradient          35    mm Hg  ----------    Right atrium                           Value        Reference  RA ID, S-I, ES, A4C            (H)     62.3  mm     34 - 49  RA area, ES, A4C               (H)     23.4  cm^2   8.3 - 19.5  RA volume, ES, A/L                     73.4  ml     ----------  RA volume/bsa, ES, A/L                 40.9  ml/m^2 ----------    Systemic veins                         Value        Reference  Estimated CVP                          3     mm Hg  ----------    Right ventricle                        Value  Reference  TAPSE                                  12.6  mm     ----------  RV pressure, S, DP             (H)     38    mm Hg  <=30  RV s&', lateral, S                      9.7   cm/s   ----------  Legend: (L)  and  (H)  mark values outside specified reference range.  ------------------------------------------------------------------- Prepared and Electronically Authenticated by  Ezzard Standing, MD [facility] 2019-09-21T16:35:21       Micro Results     No results found for this or any previous visit (from the past 240 hour(s)).     Today   Subjective    Daniel Nichols today has no  Fever or chills.  Vision improved          Patient has been seen and examined prior to discharge   Objective   Blood pressure 134/83, pulse 68, temperature 97.9 F (36.6 C), resp. rate (!) 2, height 5\' 5"  (1.651 m), weight 69.2 kg, SpO2 97 %.   Intake/Output Summary (Last 24  hours) at 05/22/2018 1559 Last data filed at 05/21/2018 1800 Gross per 24 hour  Intake 360 ml  Output -  Net 360 ml    Exam Gen:- NAD HEENT:- [place].AT,   Neck-Supple Neck,No JVD,  Lungs- mostly clear CV- S1, S2 normal Abd-  +ve B.Sounds, Abd Soft, No tenderness,    Extremity/Skin:- Intact peripheral pulses    Data Review   CBC w Diff:  Lab Results  Component Value Date   WBC 5.6 05/20/2018   HGB 14.9 05/20/2018   HCT 45.7 05/20/2018   PLT 192 05/20/2018   LYMPHOPCT 38 05/20/2018   MONOPCT 9 05/20/2018   EOSPCT 0 05/20/2018   BASOPCT 0 05/20/2018    CMP:  Lab Results  Component Value Date   NA 140 05/20/2018   K 4.4 05/20/2018   CL 105 05/20/2018   CO2 26 05/20/2018   BUN 14 05/20/2018   CREATININE 0.85 05/20/2018   PROT 5.9 (L) 05/20/2018   ALBUMIN 3.5 05/20/2018   BILITOT 1.1 05/20/2018   ALKPHOS 53 05/20/2018   AST 22 05/20/2018   ALT 13 05/20/2018  .   Total Discharge time is about 33 minutes  Daniel Nichols M.D on 05/22/2018 at 3:59 PM  [facility]   Office  [phone]  Dragon dictation system was used to create this note, attempts have been made to correct errors, however presence of uncorrected errors is not a reflection quality of care provided

## 2018-05-22 NOTE — Progress Notes (Signed)
Nsg Discharge Note  Admit Date:  05/20/2018 Discharge date: 05/22/2018   [facility] Sr. to be D/C'd Home per MD order.  AVS completed.  Copy for chart, and copy for patient signed, and dated. Patient/caregiver able to verbalize understanding.   Skin clean, dry and intact without evidence of skin break down, no evidence of skin tears noted. IV catheter discontinued intact. Site without signs and symptoms of complications - no redness or edema noted at insertion site, patient denies c/o pain - only slight tenderness at site.  Dressing with slight pressure applied.  D/c Instructions-Education: Discharge instructions given to patient/family with verbalized understanding. D/c education completed with patient/family including follow up instructions, medication list, d/c activities limitations if indicated, with other d/c instructions as indicated by MD - patient able to verbalize understanding, all questions fully answered. Patient instructed to return to ED, call 911, or call MD for any changes in condition.  Patient escorted via [place], and D/C home via private auto.  Eda Keys, RN 05/22/2018 5:36 PM

## 2018-05-23 NOTE — Progress Notes (Signed)
[facility] Note  05/24/2018     CHIEF COMPLAINT Patient presents for Retina Follow Up   HISTORY OF PRESENT ILLNESS: Daniel Sizer Sr. is a 84 y.o. male who presents to the clinic today for:   HPI    Retina Follow Up    Patient presents with  Other.  In left eye.  This started 4 days ago.  Severity is mild.  Since onset it is stable.  I, the attending physician,  performed the HPI with the patient and updated documentation appropriately.          Comments    Follow up for BRAO OS, BRVO OD. Pt. In hospital Sunday. Kept patient for 3 days of observation. Pt. To start elquist,liptior, and 66m asa. Vision in os is doing pretty good per pt.       Last edited by ZBernarda Caffey MD on 05/25/2018 10:47 PM. (History)    Pt states he feels OU VA is slightly improved; Pt states he has not began any medications the hospital prescribed;   Referring physician: KDelilah Shan MD [id number] HISTORICAL INFORMATION:   Selected notes from the MEDICAL RECORD NUMBER Referred by Dr. SSandre Kittyfor concern of CRVO OS LEE: 09.19.19 (Marigene Ehlers [BCVA: OD: CF@1 ' OS: 20/30+2] Ocular Hx-cataract OD, pseudo OS, CRVO OS, HTN ret OU, Bell's Palsy (left side), visual field defect (localized, superior) PMH-heart disease, pacemaker    CURRENT MEDICATIONS: No current outpatient medications on file. (Ophthalmic Drugs)   No current facility-administered medications for this visit.  (Ophthalmic Drugs)   Current Outpatient Medications (Other)  Medication Sig  . apixaban (ELIQUIS) 5 MG TABS tablet Take 1 tablet (5 mg total) by mouth 2 (two) times daily.  .Marland Kitchenatorvastatin (LIPITOR) 20 MG tablet Take 1 tablet (20 mg total) by mouth daily at 6 PM.  . metoprolol tartrate (LOPRESSOR) 25 MG tablet Take 25 mg by mouth 2 (two) times daily.   No current facility-administered medications for this visit.  (Other)      REVIEW OF SYSTEMS: ROS     Positive for: Skin, Musculoskeletal, HENT, Cardiovascular, Eyes   Negative for: Constitutional, Gastrointestinal, Neurological, Genitourinary, Endocrine, Respiratory, Psychiatric, Allergic/Imm, Heme/Lymph   Last edited by AElmore Guiseon 05/24/2018  2:14 PM. (History)       ALLERGIES Allergies  Allergen Reactions  . Codeine Other (See Comments)  . Levaquin [Levofloxacin In D5w] Other (See Comments)    "muscles get weak"  . Phenergan [Promethazine Hcl] Other (See Comments)    PAST MEDICAL HISTORY Past Medical History:  Diagnosis Date  . [place]rial fibrillation [place]   . Cataract   . Scalp lesion    Past Surgical History:  Procedure Laterality Date  . CATARACT EXTRACTION Left 2008  . LESION REMOVAL N/A 02/12/2015   Procedure: EXCISION SCALP LESION    (MINOR ROOM) ;  Surgeon: CRozetta Nunnery MD;  [place]  Service: ENT;  Laterality: N/A;  Posterior scalp  . NECK SURGERY Left   . PROSTATE SURGERY      FAMILY HISTORY Family History  Problem Relation Age of Onset  . Glaucoma Sister   . Glaucoma Brother     SOCIAL HISTORY Social History   Tobacco Use  . Smoking status: Never Smoker  Substance Use Topics  . Alcohol use: No  . Drug use: No         OPHTHALMIC EXAM:  Base Eye Exam    Visual Acuity (Snellen - Linear)      Right Left   Dist [place] 20/400 +2 20/25-1   Dist ph [place] 20/NI 20/NI       Tonometry (Tonopen, 2:15 PM)      Right Left   Pressure 22 16       Pupils      Dark Light React APD   Right 2 1 Sluggish Trace   Left 3 2 Minimal None       Visual Fields      Left Right    Full    Restrictions  Total inferior nasal deficiency       Extraocular Movement      Right Left    Full Full       Neuro/Psych    Oriented x3:  Yes   Mood/Affect:  Normal       Dilation    Both eyes:  1.0% Mydriacyl @ 2:15 PM        Slit Lamp and Fundus Exam    Slit Lamp Exam      Right Left   Lids/Lashes Dermatochalasis - upper  lid, Telangiectasia, Meibomian gland dysfunction, marginal lesion centrally Dermatochalasis - upper lid, Meibomian gland dysfunction, Telangiectasia   Conjunctiva/Sclera White and quiet White and quiet   Cornea Arcus, 1+ Punctate epithelial erosions Arcus, 2+ Punctate epithelial erosions   Anterior Chamber Narrow angles Deep and quiet   Iris Round and moderately dilated Round and moderately dilated, Transillumination defects at 1200 and 0800   Lens 3-4+ Nuclear sclerosis with brunescence , 3+ Cortical cataract Posterior chamber intraocular lens   Vitreous  Vitreous syneresis, Posterior vitreous detachment       Fundus Exam      Right Left   Disc Hazy view, Peripapillary atrophy hyperemic; blurred margin   C/D Ratio 0.3    Macula Very hazy view, scattered IRH superior macula and along ST arcades Blunted foveal reflex, sectoral retinal whitening nasal to fovea, tortuous vessels, DBH   Vessels hazy view; tortuous Tortuous; dilated veins, attenuated arterioles    Periphery attached; scattered heme superiorly attached; scattered DBH   Poor view in right eye          IMAGING AND PROCEDURES  Imaging and Procedures for @TODAY @  OCT, Retina - OU - Both Eyes       Right Eye Quality was good. Central Foveal Thickness: 370. Progression has improved. Findings include abnormal foveal contour, intraretinal fluid, no SRF, epiretinal membrane (Mild interval improvement in IRF).   Left Eye Quality was good. Central Foveal Thickness: 275. Progression has improved. Findings include intraretinal hyper-reflective material, normal foveal contour, no IRF, no SRF (IRHM nasal to fovea, nasal improvement improvement in nasal thickening).   Notes *Images captured and stored on drive  Diagnosis / Impression:  OD: Superior BRVO with CME - mild interval improvement in IRF OS: NFP with focal IRHM nasal to fovea - BRAO/PAMM  Clinical management:  See below  Abbreviations: NFP - Normal foveal profile. CME  - cystoid macular edema. PED - pigment epithelial detachment. IRF - intraretinal fluid. SRF - subretinal fluid. EZ - ellipsoid zone. ERM - epiretinal membrane. ORA - outer retinal atrophy. ORT - outer retinal tubulation. SRHM - subretinal hyper-reflective material                  ASSESSMENT/PLAN:    ICD-10-CM   1. 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 Branch retinal vein occlusion of right eye with macular edema H34.8310   3. Retinal edema H35.81 OCT, Retina - OU - Both Eyes  4. Mature cataract H26.9   5. Essential hypertension I10   6. Hypertensive retinopathy of both eyes H35.033   7. Pseudophakia Z96.1     1. BRAO OS - sector of retinal whitening superonasal macula and mild disc edema w/ tortuous vessels OS - pt presented w/ scotoma OS and episodes of amaurosis fugax - OCT shows paracentral acute middle maculopathy (PAMM) nasal to fovea corresponding to area of retinal whitening - ?early CRVO component given tortuous vessels and scattered dot hemes - was admitted over the weekend for stroke work up -- no positive findings upon work up but did start some medications to treat Afib and reduce stroke risk - interestingly, VA OS has improved and returned to baseline acuity - discussed findings and prognosis - pt has history of Afib and mitral regurg, followed by Dr. Otho Perl - ESR, CRP wnl, and stroke work up w/ carotid dopplers and echocardiogram -- negative - F/U 4 weeks  2,3. BRVO w/ macular edema OD - The natural history of retinal vein occlusion and macular edema and treatment options including observation, laser photocoagulation, and intravitreal antiVEGF injection with Avastin and Lucentis and Eylea and intravitreal injection of steroids with triamcinolone and Ozurdex and the complications of these procedures including loss of vision, infection, cataract, glaucoma, and retinal detachment were discussed with patient. - Specifically  discussed findings from [place] study regarding patient stabilization with anti-VEGF agents and increased potential for visual improvements.  Also discussed need for frequent follow up and potentially multiple injections given the chronic nature of the disease process - BCVA 39/030, but complicated by mature cataract OD - OCT shows CME w/ interval improvement since 05/20/18 - discussed treating w/ IVA OD and possible increased risk of stroke/ cardiovascular event associated with anti-VEGF therapy - pt and family wish to observe for now as he starts his new medications  - F/U 4 weeks, sooner prn  4. Combined form age related cataract OD- mature  - The symptoms of cataract, surgical options, and treatments and risks were discussed with patient. - discussed diagnosis and progression - visually significant and complicating subacute BRVO OD - will refer to Dr. Bing Plume for cataract evaluation   5,6. Hypertensive retinopathy OU - discussed importance of tight BP control - discussed relationship of BP to #1 and #2 above - monitor  7. Pseudophakia OS  - s/p CE/IOL OS ([place])  - beautiful surgery, doing well  - monitor   Ophthalmic Meds Ordered this visit:  No orders of the defined types were placed in this encounter.      Return in about 4 weeks (around 06/21/2018) for F/U BRAO OS, DFE, OCT.   There are no Patient Instructions on file for this visit.   Explained the diagnoses, plan, and follow up with the patient and they expressed understanding.  Patient expressed understanding of the importance of proper follow up care.   This document serves as a record of services personally performed by Gardiner Sleeper, MD, PhD. It was created on their behalf by Ernest Mallick, OA, an ophthalmic assistant. The creation of this record is the provider's dictation and/or activities during the visit.    Electronically signed by: Ernest Mallick, OA  09.23.19 10:47 PM    Gardiner Sleeper, M.D.,  Ph.D. Diseases & Surgery of the Retina and Vitreous [facility]   I have reviewed the above documentation for accuracy and completeness, and I agree with the above. Gardiner Sleeper, M.D., Ph.D. 05/25/18 10:54 PM      Abbreviations: M myopia (nearsighted); A astigmatism; H hyperopia (farsighted); P presbyopia; Mrx spectacle prescription;  CTL contact lenses; OD right eye; OS left eye; OU both eyes  XT exotropia; ET esotropia; PEK punctate epithelial keratitis; PEE punctate epithelial erosions; DES dry eye syndrome; MGD meibomian gland dysfunction; ATs artificial tears; PFAT's preservative free artificial tears; [place] nuclear sclerotic cataract; PSC posterior subcapsular cataract; ERM epi-retinal membrane; PVD posterior vitreous detachment; RD retinal detachment; DM diabetes mellitus; DR diabetic retinopathy; NPDR non-proliferative diabetic retinopathy; PDR proliferative diabetic retinopathy; CSME clinically significant macular edema; DME diabetic macular edema; dbh dot blot hemorrhages; CWS cotton wool spot; POAG primary open angle glaucoma; C/D cup-to-disc ratio; HVF humphrey visual field; GVF goldmann visual field; OCT optical coherence tomography; IOP intraocular pressure; BRVO Branch retinal vein occlusion; CRVO central retinal vein occlusion; CRAO central retinal artery occlusion; BRAO branch retinal artery occlusion; RT retinal tear; SB scleral buckle; PPV pars plana vitrectomy; VH Vitreous hemorrhage; PRP panretinal laser photocoagulation; IVK intravitreal kenalog; VMT vitreomacular traction; MH Macular hole;  NVD neovascularization of the disc; NVE neovascularization elsewhere; AREDS age related eye disease study; ARMD age related macular degeneration; POAG primary open angle glaucoma; EBMD epithelial/anterior basement membrane dystrophy; ACIOL anterior chamber intraocular lens; IOL intraocular lens; PCIOL posterior chamber intraocular lens; Phaco/IOL phacoemulsification with  intraocular lens placement; Daniel photorefractive keratectomy; LASIK laser assisted in situ keratomileusis; HTN hypertension; DM diabetes mellitus; COPD chronic obstructive pulmonary disease

## 2018-05-24 ENCOUNTER — Ambulatory Visit (INDEPENDENT_AMBULATORY_CARE_PROVIDER_SITE_OTHER): Payer: Medicare Other | Admitting: Ophthalmology

## 2018-05-24 DIAGNOSIS — H34831 Tributary (branch) retinal vein occlusion, right eye, with macular edema: Secondary | ICD-10-CM | POA: Diagnosis not present

## 2018-05-24 DIAGNOSIS — H35033 Hypertensive retinopathy, bilateral: Secondary | ICD-10-CM

## 2018-05-24 DIAGNOSIS — H269 Unspecified cataract: Secondary | ICD-10-CM | POA: Diagnosis not present

## 2018-05-24 DIAGNOSIS — H3581 Retinal edema: Secondary | ICD-10-CM

## 2018-05-24 DIAGNOSIS — H268 Other specified cataract: Secondary | ICD-10-CM

## 2018-05-24 DIAGNOSIS — Z961 Presence of intraocular lens: Secondary | ICD-10-CM

## 2018-05-24 DIAGNOSIS — I10 Essential (primary) hypertension: Secondary | ICD-10-CM

## 2018-05-24 DIAGNOSIS — H34232 Retinal artery branch occlusion, left eye: Secondary | ICD-10-CM | POA: Diagnosis not present

## 2018-05-25 ENCOUNTER — Encounter (INDEPENDENT_AMBULATORY_CARE_PROVIDER_SITE_OTHER): Payer: Self-pay | Admitting: Ophthalmology

## 2018-06-07 ENCOUNTER — Telehealth (INDEPENDENT_AMBULATORY_CARE_PROVIDER_SITE_OTHER): Payer: Self-pay

## 2018-06-07 NOTE — Telephone Encounter (Signed)
Pt wife called stating that Daniel Nichols had an episode of flashes of light this morning upon waking; Pt states the flashes "lasted about a split second"; Pt denies any floaters, denies any ocular pain, denies any change in [place], denies any curtain coming over [place]; Advised pt to monitor symptoms and to call back if symptoms become more persistent; Pt expressed understanding;   Catha Brow, [place]

## 2018-06-21 ENCOUNTER — Encounter (INDEPENDENT_AMBULATORY_CARE_PROVIDER_SITE_OTHER): Payer: Medicare Other | Admitting: Ophthalmology

## 2018-07-06 ENCOUNTER — Encounter (INDEPENDENT_AMBULATORY_CARE_PROVIDER_SITE_OTHER): Payer: Medicare Other | Admitting: Ophthalmology

## 2018-09-05 ENCOUNTER — Encounter (INDEPENDENT_AMBULATORY_CARE_PROVIDER_SITE_OTHER): Payer: Self-pay | Admitting: Orthopedic Surgery

## 2018-09-05 ENCOUNTER — Ambulatory Visit (INDEPENDENT_AMBULATORY_CARE_PROVIDER_SITE_OTHER): Payer: Medicare Other | Admitting: Orthopedic Surgery

## 2018-09-05 ENCOUNTER — Ambulatory Visit (INDEPENDENT_AMBULATORY_CARE_PROVIDER_SITE_OTHER): Payer: Medicare Other

## 2018-09-05 VITALS — Ht 65.0 in | Wt 152.0 lb

## 2018-09-05 DIAGNOSIS — M79661 Pain in right lower leg: Secondary | ICD-10-CM

## 2018-09-05 DIAGNOSIS — S8011XS Contusion of right lower leg, sequela: Secondary | ICD-10-CM | POA: Diagnosis not present

## 2018-09-05 NOTE — Progress Notes (Signed)
Office Visit Note   Patient: Daniel Brawn Sr.           Date of Birth: 1928-05-10           MRN: [id number] Visit Date: 09/05/2018              Requested by: Delilah Shan, MD [address] [place], [place] [address] PCP: Delilah Shan, MD  Chief Complaint  Patient presents with  . Right Foot - Pain  . Right Ankle - Pain      HPI: Patient is an 85 year old gentleman who sustained a fall 1 week ago sustaining blunt trauma to the right leg patient complains of pain with weightbearing increased swelling increased bruising with a large necrotic gash over his shin.  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  Assessment & Plan: Visit Diagnoses:  1. Pain in right lower leg   2. Leg hematoma, right, sequela     Plan: Discussed with patient he does have a expanding hematoma from his blood thinner.  Recommended that he follow-up with his cardiologist to see if he can get off the blood thinner for any period of time.  Patient was placed in a small medical compression stocking and given a prescription for a medium medical compression stocking 15 to 20 mm of compression.  Recommend he wear the stockings around-the-clock he is to elevate his foot level with his heart whenever at rest and he may ambulate but not stand around.  Patient will follow-up next week.  Follow-Up Instructions: Return in about 1 week (around 09/12/2018).   Ortho Exam  Patient is alert, oriented, no adenopathy, well-dressed, normal affect, normal respiratory effort. Examination patient has a good dorsalis pedis pulse there is ecchymosis and bruising that goes all the way down to the forefoot he has a hematoma over the anterior lateral aspect of the tibia that is approximately 5 cm in diameter.  There is a large area of black eschar over the traumatic wound with ischemic soft tissue changes secondary to the swelling from the hematoma.  Patient's calf is 35 cm in  circumference.  Imaging: Xr Tibia/fibula Right  Result Date: 09/05/2018 2 view radiographs of the right leg shows no bony abnormalities no fractures.    Labs: Lab Results  Component Value Date   HGBA1C 5.9 (H) 05/21/2018   ESRSEDRATE 1 05/20/2018   CRP <0.8 05/20/2018     Lab Results  Component Value Date   ALBUMIN 3.5 05/20/2018    Body mass index is 25.29 kg/m.  Orders:  Orders Placed This Encounter  Procedures  . XR Tibia/Fibula Right   No orders of the defined types were placed in this encounter.    Procedures: No procedures performed  Clinical Data: No additional findings.  ROS:  All other systems negative, except as noted in the HPI. Review of Systems  Objective: Vital Signs: Ht 5\' 5"  (1.651 m)   Wt 152 lb (68.9 kg)   BMI 25.29 kg/m   Specialty Comments:  No specialty comments available.  PMFS History: Patient Active Problem List   Diagnosis Date Noted  . BRAO (branch retinal artery occlusion), left 05/20/2018  . Atrial fibrillation ([place]) 05/20/2018  . Intracranial aneurysm 05/20/2018  . Retinal artery occlusion    Past Medical History:  Diagnosis Date  . Atrial fibrillation ([place])   . Cataract   . Scalp lesion     Family History  Problem Relation Age  of Onset  . Glaucoma Sister   . Glaucoma Brother     Past Surgical History:  Procedure Laterality Date  . CATARACT EXTRACTION Left 2008  . LESION REMOVAL N/A 02/12/2015   Procedure: EXCISION SCALP LESION    (MINOR ROOM) ;  Surgeon: Rozetta Nunnery, MD;  Location: [facility];  Service: ENT;  Laterality: N/A;  Posterior scalp  . NECK SURGERY Left   . PROSTATE SURGERY     Social History   Occupational History  . Not on file  Tobacco Use  . Smoking status: Never Smoker  . Smokeless tobacco: Never Used  Substance and Sexual Activity  . Alcohol use: No  . Drug use: No  . Sexual activity: Not on file

## 2018-09-06 ENCOUNTER — Telehealth (INDEPENDENT_AMBULATORY_CARE_PROVIDER_SITE_OTHER): Payer: Self-pay | Admitting: Orthopedic Surgery

## 2018-09-06 NOTE — Telephone Encounter (Signed)
You evaluated this pt in the office yesterday please see below and advise.

## 2018-09-06 NOTE — Telephone Encounter (Signed)
Patient's daughter Wynonia Sours) called advised patient is wearing the small stocking and is having a lot of pain when he lowers his foot to stand up. Mechele Claude asked if this was normal or does he need to put on a medium stocking. The number to contact Mechele Claude is  [phone]

## 2018-09-06 NOTE — Telephone Encounter (Signed)
Have him change to a medium sock and elevate foot level with his heart

## 2018-09-07 NOTE — Telephone Encounter (Signed)
I had called and sw pt's daughter earlier today and she states that they have the medium sock and that she will have him try this and call back if this does not help.

## 2018-09-12 ENCOUNTER — Telehealth (INDEPENDENT_AMBULATORY_CARE_PROVIDER_SITE_OTHER): Payer: Self-pay | Admitting: *Deleted

## 2018-09-12 ENCOUNTER — Ambulatory Visit (INDEPENDENT_AMBULATORY_CARE_PROVIDER_SITE_OTHER): Payer: Medicare Other | Admitting: Physician Assistant

## 2018-09-12 ENCOUNTER — Encounter (INDEPENDENT_AMBULATORY_CARE_PROVIDER_SITE_OTHER): Payer: Self-pay | Admitting: Orthopedic Surgery

## 2018-09-12 DIAGNOSIS — S8011XS Contusion of right lower leg, sequela: Secondary | ICD-10-CM

## 2018-09-12 MED ORDER — DOXYCYCLINE HYCLATE 100 MG PO CAPS: 100.0000 mg | ORAL_CAPSULE | Freq: Two times a day (BID) | ORAL | 0 refills | Status: AC

## 2018-09-12 NOTE — Telephone Encounter (Signed)
Pt was prescribed antibiotic earlier today for cellulits and pt states that he has taken one and feels like the pain has increased. Pt wants to know if you think he needs another antibotic to help.   Please call [phone]

## 2018-09-12 NOTE — Progress Notes (Signed)
Office Visit Note   Patient: Daniel Delpriore Sr.           Date of Birth: [birth year]           MRN: [id number] Visit Date: 09/12/2018              Requested by: Delilah Shan, MD [address] [place], [place] [address] PCP: Delilah Shan, MD  Chief Complaint  Patient presents with  . Right Leg - Pain      HPI: The patient is a 85 yo gentleman here with his family for follow up of his right lower leg ulceration. He reports the redness over the ulcerated area is better, but now there is some redness of the right foot. The is some residual swelling and he reports he is having difficulty walking and putting weight on the right leg.  The cardiologist okayed staying off his Eliquis and he has not been taking it for the past week.   Assessment & Plan: Visit Diagnoses:  1. Leg hematoma, right, sequela     Plan: Start Doxycycline 100 mg BID. Continue to elevate the right leg as much as possible. Continue compression sock except for hygiene. Follow up in 1 week.   Follow-Up Instructions: Return in about 1 week (around 09/19/2018).   Ortho Exam  Patient is alert, oriented, no adenopathy, well-dressed, normal affect, normal respiratory effort. The right lower leg ulcer is crusted/dark eschar over and has minimal residual erythema about the ulcerated area. The swelling is improved. There is some mild erythema over the right lateral foot. Good pedal pulses. The ecchymosis is maturing and radiates from the calf to the toes.   Imaging: No results found.   Labs: Lab Results  Component Value Date   HGBA1C 5.9 (H) 05/21/2018   ESRSEDRATE 1 05/20/2018   CRP <0.8 05/20/2018     Lab Results  Component Value Date   ALBUMIN 3.5 05/20/2018    There is no height or weight on file to calculate BMI.  Orders:  No orders of the defined types were placed in this encounter.  Meds ordered this encounter  Medications  . doxycycline (VIBRAMYCIN) 100 MG capsule    Sig: Take 1  capsule (100 mg total) by mouth 2 (two) times daily.    Dispense:  28 capsule    Refill:  0     Procedures: No procedures performed  Clinical Data: No additional findings.  ROS:  All other systems negative, except as noted in the HPI. Review of Systems  Objective: Vital Signs: There were no vitals taken for this visit.  Specialty Comments:  No specialty comments available.  PMFS History: Patient Active Problem List   Diagnosis Date Noted  . BRAO (branch retinal artery occlusion), left 05/20/2018  . Atrial fibrillation ([place]) 05/20/2018  . Intracranial aneurysm 05/20/2018  . Retinal artery occlusion    Past Medical History:  Diagnosis Date  . Atrial fibrillation ([place])   . Cataract   . Scalp lesion     Family History  Problem Relation Age of Onset  . Glaucoma Sister   . Glaucoma Brother     Past Surgical History:  Procedure Laterality Date  . CATARACT EXTRACTION Left 2008  . LESION REMOVAL N/A 02/12/2015   Procedure: EXCISION SCALP LESION    (MINOR ROOM) ;  Surgeon: Rozetta Nunnery, MD;  Location: [facility];  Service: ENT;  Laterality: N/A;  Posterior scalp  . NECK SURGERY Left   .  PROSTATE SURGERY     Social History   Occupational History  . Not on file  Tobacco Use  . Smoking status: Never Smoker  . Smokeless tobacco: Never Used  Substance and Sexual Activity  . Alcohol use: No  . Drug use: No  . Sexual activity: Not on file

## 2018-09-13 ENCOUNTER — Encounter (INDEPENDENT_AMBULATORY_CARE_PROVIDER_SITE_OTHER): Payer: Self-pay | Admitting: Physician Assistant

## 2018-09-13 NOTE — Telephone Encounter (Signed)
Called pt and home number has been disconnected. Called mobile number listed in chart. Lm on vm to advise that abx will not cause increase in pain. Advised pt that he can elevate his foot higher than his heart and that controlling the swelling will decrease the pain. If he notices any other increase in symptom to call the office if he continues to have discomfort to call for appt we are happy to see him at any time.

## 2018-09-14 ENCOUNTER — Encounter (INDEPENDENT_AMBULATORY_CARE_PROVIDER_SITE_OTHER): Payer: Medicare Other | Admitting: Ophthalmology

## 2018-09-22 ENCOUNTER — Ambulatory Visit (INDEPENDENT_AMBULATORY_CARE_PROVIDER_SITE_OTHER): Payer: Medicare Other | Admitting: Orthopedic Surgery

## 2018-09-22 ENCOUNTER — Encounter (INDEPENDENT_AMBULATORY_CARE_PROVIDER_SITE_OTHER): Payer: Self-pay | Admitting: Orthopedic Surgery

## 2018-09-22 VITALS — Ht 65.0 in | Wt 152.0 lb

## 2018-09-22 DIAGNOSIS — S8011XS Contusion of right lower leg, sequela: Secondary | ICD-10-CM

## 2018-09-23 ENCOUNTER — Encounter (INDEPENDENT_AMBULATORY_CARE_PROVIDER_SITE_OTHER): Payer: Self-pay | Admitting: Orthopedic Surgery

## 2018-09-23 NOTE — Progress Notes (Signed)
   Office Visit Note   Patient: Daniel Pollio Sr.           Date of Birth: 02-21-1928           MRN: [id number] Visit Date: 09/22/2018              Requested by: Delilah Shan, MD [address] [place], [place] [address] PCP: Delilah Shan, MD  Chief Complaint  Patient presents with  . Right Leg - Follow-up, Pain, Edema      HPI: Patient is an 85 year old gentleman who presents in follow-up for hematoma right lower extremity secondary to blunt trauma while being on a blood thinner.  Patient states he has been using a heating pad on his leg he states he still has constant pain with increased swelling  Assessment & Plan: Visit Diagnoses:  1. Leg hematoma, right, sequela     Plan: Recommended against using the heating pad recommended elevation continue with the compression sock wear this around the clock.  Follow-Up Instructions: Return in about 2 weeks (around 10/06/2018).   Ortho Exam  Patient is alert, oriented, no adenopathy, well-dressed, normal affect, normal respiratory effort. Examination patient has decreased redness around the black eschar abrasion over the tibial crest.  The swelling is slowly resolving the skin and soft tissue is looking better there is no redness no cellulitis no drainage no signs of infection.  Imaging: No results found. No images are attached to the encounter.  Labs: Lab Results  Component Value Date   HGBA1C 5.9 (H) 05/21/2018   ESRSEDRATE 1 05/20/2018   CRP <0.8 05/20/2018     Lab Results  Component Value Date   ALBUMIN 3.5 05/20/2018    Body mass index is 25.29 kg/m.  Orders:  No orders of the defined types were placed in this encounter.  No orders of the defined types were placed in this encounter.    Procedures: No procedures performed  Clinical Data: No additional findings.  ROS:  All other systems negative, except as noted in the HPI. Review of Systems  Objective: Vital Signs: Ht 5\' 5"  (1.651 m)    Wt 152 lb (68.9 kg)   BMI 25.29 kg/m   Specialty Comments:  No specialty comments available.  PMFS History: Patient Active Problem List   Diagnosis Date Noted  . BRAO (branch retinal artery occlusion), left 05/20/2018  . Atrial fibrillation ([place]) 05/20/2018  . Intracranial aneurysm 05/20/2018  . Retinal artery occlusion    Past Medical History:  Diagnosis Date  . Atrial fibrillation ([place])   . Cataract   . Scalp lesion     Family History  Problem Relation Age of Onset  . Glaucoma Sister   . Glaucoma Brother     Past Surgical History:  Procedure Laterality Date  . CATARACT EXTRACTION Left 2008  . LESION REMOVAL N/A 02/12/2015   Procedure: EXCISION SCALP LESION    (MINOR ROOM) ;  Surgeon: Rozetta Nunnery, MD;  Location: [facility];  Service: ENT;  Laterality: N/A;  Posterior scalp  . NECK SURGERY Left   . PROSTATE SURGERY     Social History   Occupational History  . Not on file  Tobacco Use  . Smoking status: Never Smoker  . Smokeless tobacco: Never Used  Substance and Sexual Activity  . Alcohol use: No  . Drug use: No  . Sexual activity: Not on file

## 2018-10-03 ENCOUNTER — Encounter (INDEPENDENT_AMBULATORY_CARE_PROVIDER_SITE_OTHER): Payer: Self-pay | Admitting: Orthopedic Surgery

## 2018-10-03 ENCOUNTER — Ambulatory Visit (INDEPENDENT_AMBULATORY_CARE_PROVIDER_SITE_OTHER): Payer: Medicare Other | Admitting: Orthopedic Surgery

## 2018-10-03 VITALS — Ht 65.0 in | Wt 152.0 lb

## 2018-10-03 DIAGNOSIS — S8011XD Contusion of right lower leg, subsequent encounter: Secondary | ICD-10-CM | POA: Diagnosis not present

## 2018-10-03 DIAGNOSIS — S8011XS Contusion of right lower leg, sequela: Secondary | ICD-10-CM

## 2018-10-03 NOTE — Progress Notes (Signed)
   Office Visit Note   Patient: Daniel Jafari Sr.           Date of Birth: 1927/10/16           MRN: [id number] Visit Date: 10/03/2018              Requested by: Delilah Shan, MD [address] [place], [place] [address] PCP: Delilah Shan, MD  Chief Complaint  Patient presents with  . Right Leg - Follow-up      HPI: Patient is an 85 year old gentleman who presents in follow-up for traumatic hematoma right leg with ulceration he has been wearing the Vive stocking.  Patient states that he has had excellent progress and states that his wound and leg look great.  Assessment & Plan: Visit Diagnoses:  1. Leg hematoma, right, sequela     Plan: Patient will continue wearing the knee-high medical compression stocking.  Follow-up as needed.  Follow-Up Instructions: Return if symptoms worsen or fail to improve.   Ortho Exam  Patient is alert, oriented, no adenopathy, well-dressed, normal affect, normal respiratory effort. Examination patient has significant decreased swelling in the right leg.  The ulcer is healed there is a superficial scab the hematoma is almost completely resolved the discoloration is almost completely resolved there is no redness no drainage no swelling no open wounds.  Imaging: No results found. No images are attached to the encounter.  Labs: Lab Results  Component Value Date   HGBA1C 5.9 (H) 05/21/2018   ESRSEDRATE 1 05/20/2018   CRP <0.8 05/20/2018     Lab Results  Component Value Date   ALBUMIN 3.5 05/20/2018    Body mass index is 25.29 kg/m.  Orders:  No orders of the defined types were placed in this encounter.  No orders of the defined types were placed in this encounter.    Procedures: No procedures performed  Clinical Data: No additional findings.  ROS:  All other systems negative, except as noted in the HPI. Review of Systems  Objective: Vital Signs: Ht 5\' 5"  (1.651 m)   Wt 152 lb (68.9 kg)   BMI 25.29 kg/m    Specialty Comments:  No specialty comments available.  PMFS History: Patient Active Problem List   Diagnosis Date Noted  . BRAO (branch retinal artery occlusion), left 05/20/2018  . Atrial fibrillation ([place]) 05/20/2018  . Intracranial aneurysm 05/20/2018  . Retinal artery occlusion    Past Medical History:  Diagnosis Date  . Atrial fibrillation ([place])   . Cataract   . Scalp lesion     Family History  Problem Relation Age of Onset  . Glaucoma Sister   . Glaucoma Brother     Past Surgical History:  Procedure Laterality Date  . CATARACT EXTRACTION Left 2008  . LESION REMOVAL N/A 02/12/2015   Procedure: EXCISION SCALP LESION    (MINOR ROOM) ;  Surgeon: Rozetta Nunnery, MD;  Location: [facility];  Service: ENT;  Laterality: N/A;  Posterior scalp  . NECK SURGERY Left   . PROSTATE SURGERY     Social History   Occupational History  . Not on file  Tobacco Use  . Smoking status: Never Smoker  . Smokeless tobacco: Never Used  Substance and Sexual Activity  . Alcohol use: No  . Drug use: No  . Sexual activity: Not on file

## 2018-10-04 ENCOUNTER — Telehealth (INDEPENDENT_AMBULATORY_CARE_PROVIDER_SITE_OTHER): Payer: Self-pay | Admitting: Orthopedic Surgery

## 2018-10-04 NOTE — Telephone Encounter (Signed)
Patient wife called needed to discuss Daniel Nichols current medication. [name] wanted to know if her husband should continue to take eliquis or stop taking it.

## 2018-10-04 NOTE — Telephone Encounter (Signed)
We saw this pt yesterday in follow up to a hematoma to the lower leg. It is healing well and we did not order his eliquis. The pt is asking if he still needs to take it?

## 2018-10-04 NOTE — Telephone Encounter (Signed)
Have patient check with his cardiologist to see if he should resume his Eliquis. It should not be a problem to resume from our standpoint at this time.

## 2018-10-05 NOTE — Telephone Encounter (Signed)
I called and lm on vm for pt to advise of message below. To call with questions.  

## 2018-10-24 ENCOUNTER — Encounter (INDEPENDENT_AMBULATORY_CARE_PROVIDER_SITE_OTHER): Payer: Medicare Other | Admitting: Ophthalmology

## 2018-10-24 NOTE — Progress Notes (Signed)
[facility] Note  10/25/2018     CHIEF COMPLAINT Patient presents for Retina Follow Up   HISTORY OF PRESENT ILLNESS: Daniel Sizer Sr. is a 85 y.o. male who presents to the clinic today for:   HPI    Retina Follow Up    Patient presents with  CRVO/BRVO.  In both eyes.  This started 5 months ago.  Severity is moderate.  Duration of 5 months.  Since onset it is stable.  I, the attending physician,  performed the HPI with the patient and updated documentation appropriately.          Comments    Patient here for 5 month follow for BRAO OS/BRVO OD. Patient states vision been doing fine. No eye pain. Patient fell injured leg about 2 months ago. Taken off eliquis till leg heals.       Last edited by Bernarda Caffey, MD on 10/25/2018 11:03 PM. (History)    pt is with his wife and daughter, his daugter states he had cataract sx OD in October and then fell and hurt his foot and was unable to do anything for 6-8 weeks,   Referring physician: Delilah Shan, MD [address] [place], [place] [address]  HISTORICAL INFORMATION:   Selected notes from the MEDICAL RECORD NUMBER Referred by Dr. Sandre Kitty for concern of CRVO OS LEE: 09.19.19 Marigene Ehlers) [BCVA: OD: CF@1 ' OS: 20/30+2] Ocular Hx-cataract OD, pseudo OS, CRVO OS, HTN ret OU, Bell's Palsy (left side), visual field defect (localized, superior) PMH-heart disease, pacemaker    CURRENT MEDICATIONS: No current outpatient medications on file. (Ophthalmic Drugs)   No current facility-administered medications for this visit.  (Ophthalmic Drugs)   Current Outpatient Medications (Other)  Medication Sig  . apixaban (ELIQUIS) 5 MG TABS tablet Take 1 tablet (5 mg total) by mouth 2 (two) times daily.  Marland Kitchen atorvastatin (LIPITOR) 20 MG tablet Take 1 tablet (20 mg total) by mouth daily at 6 PM.  . doxycycline (VIBRAMYCIN) 100 MG capsule Take 1 capsule (100 mg total) by mouth 2 (two) times daily.  .  metoprolol tartrate (LOPRESSOR) 25 MG tablet Take 25 mg by mouth 2 (two) times daily.   Current Facility-Administered Medications (Other)  Medication Route  . Bevacizumab (AVASTIN) SOLN 1.25 mg Intravitreal      REVIEW OF SYSTEMS: ROS    Positive for: Skin, Musculoskeletal, HENT, Cardiovascular, Eyes   Negative for: Constitutional, Gastrointestinal, Neurological, Genitourinary, Endocrine, Respiratory, Psychiatric, Allergic/Imm, Heme/Lymph   Last edited by Theodore Demark on 10/25/2018  1:12 PM. (History)       ALLERGIES Allergies  Allergen Reactions  . Codeine Other (See Comments)  . Levaquin [Levofloxacin In D5w] Other (See Comments)    "muscles get weak"  . Phenergan [Promethazine Hcl] Other (See Comments)    PAST MEDICAL HISTORY Past Medical History:  Diagnosis Date  . Atrial fibrillation ([place])   . Cataract   . Scalp lesion    Past Surgical History:  Procedure Laterality Date  . CATARACT EXTRACTION Left 2008  . LESION REMOVAL N/A 02/12/2015   Procedure: EXCISION SCALP LESION    (MINOR ROOM) ;  Surgeon: Rozetta Nunnery, MD;  Location: [facility];  Service: ENT;  Laterality: N/A;  Posterior scalp  . NECK SURGERY Left   . PROSTATE SURGERY      FAMILY HISTORY Family History  Problem Relation Age of Onset  . Glaucoma Sister   . Glaucoma Brother  SOCIAL HISTORY Social History   Tobacco Use  . Smoking status: Never Smoker  . Smokeless tobacco: Never Used  Substance Use Topics  . Alcohol use: No  . Drug use: No         OPHTHALMIC EXAM:  Base Eye Exam    Visual Acuity (Snellen - Linear)      Right Left   Dist [place] CF at 2' 20/25 -1   Dist ph [place] NI        Tonometry (Tonopen, 1:07 PM)      Right Left   Pressure 22 18       Pupils      Dark Light Shape React APD   Right 3 2  Sluggish Trace   Left 2 1 Round Sluggish None       Visual Fields (Counting fingers)      Left Right    Full    Restrictions  Total superior  temporal deficiency       Neuro/Psych    Oriented x3:  Yes   Mood/Affect:  Normal       Dilation    Both eyes:  1.0% Mydriacyl, 2.5% Phenylephrine @ 1:07 PM        Slit Lamp and Fundus Exam    Slit Lamp Exam      Right Left   Lids/Lashes Dermatochalasis - upper lid, Meibomian gland dysfunction Dermatochalasis - upper lid, Meibomian gland dysfunction, incomplete blink   Conjunctiva/Sclera mild nasal Pinguecula White and quiet   Cornea Arcus, Well healed cataract wounds with residual edema and corneal haze, 1+ Descemet's folds, 1+endopigment inferiorly Irregular epi inferiorly, 3-4+ Punctate epithelial erosions inferiorly   Anterior Chamber Deep and quiet Deep and quiet   Iris Round and poorly dilated to 53m, Transillumination defects 0230 0500 0630 0730 0900 Round and poorly dilated   Lens PC IOL in good position slightly shifted temporally and nasally PC IOL in good position   Vitreous Vitreous syneresis, Posterior vitreous detachment Vitreous syneresis, Posterior vitreous detachment       Fundus Exam      Right Left   Disc Mild pallor, mild Peripapillary atrophy, superior rim thinning Mild pallor, superior rim thinning   C/D Ratio 0.6 0.6   Macula Central CME, +exudates, +DBH, +telangectetic vessels Flat, blunted foveal reflex, No heme or edema   Vessels Vascular attenuation Vascular attenuation, Tortuous   Periphery attached, IRH along ST arcades and superiorly consistent with BRVO attached; scattered DBH          IMAGING AND PROCEDURES  Imaging and Procedures for @TODAY @  OCT, Retina - OU - Both Eyes       Right Eye Quality was good. Central Foveal Thickness: 538. Progression has worsened. Findings include abnormal foveal contour, intraretinal fluid, no SRF, epiretinal membrane, inner retinal atrophy, outer retinal atrophy, cystoid macular edema (interval worsening of IRF).   Left Eye Quality was good. Central Foveal Thickness: 262. Progression has been stable.  Findings include normal foveal contour, no IRF, no SRF (Focal thinning nasal macula).   Notes *Images captured and stored on drive  Diagnosis / Impression:  OD: Superior BRVO with CME - interval worsening in IRF OS: NFP with focal thinning nasal to fovea - remote BRAO/PAMM -- stable from prior  Clinical management:  See below  Abbreviations: NFP - Normal foveal profile. CME - cystoid macular edema. PED - pigment epithelial detachment. IRF - intraretinal fluid. SRF - subretinal fluid. EZ - ellipsoid zone. ERM - epiretinal membrane. ORA -  outer retinal atrophy. ORT - outer retinal tubulation. SRHM - subretinal hyper-reflective material         Intravitreal Injection, Pharmacologic Agent - OD - Right Eye       Time Out 10/25/2018. 2:15 PM. Confirmed correct patient, procedure, site, and patient consented.   Anesthesia Topical anesthesia was used. Anesthetic medications included Lidocaine 2%, Proparacaine 0.5%.   Procedure Preparation included 5% betadine to ocular surface, eyelid speculum. A supplied needle was used.   Injection:  1.25 mg Bevacizumab (AVASTIN) SOLN   NDC: 62952-841-32, Lot: [id number] , Expiration date: 12/21/2018   Route: Intravitreal, Site: Right Eye, Waste: 0 mL  Post-op Post injection exam found visual acuity of at least counting fingers. The patient tolerated the procedure well. There were no complications. The patient received written and verbal post procedure care education.                 ASSESSMENT/PLAN:    ICD-10-CM   1. Branch retinal vein occlusion of right eye with macular edema H34.8310 Intravitreal Injection, Pharmacologic Agent - OD - Right Eye    Bevacizumab (AVASTIN) SOLN 1.25 mg  2. Retinal edema H35.81 OCT, Retina - OU - Both Eyes  3. 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 Pseudophakia of both eyes Z96.1   5. Essential hypertension I10   6. Hypertensive retinopathy of both eyes H35.033     1,2. BRVO w/  macular edema OD  - BCVA CF@2ft   - OCT shows CME w/ interval worsening since 9/19  - discussed treating w/ IVA OD and possible increased risk of stroke/ cardiovascular event associated with anti-VEGF therapy  - recommend IVA OD #1 today, 02.25.20  - RBA of procedure discussed, questions answered  - informed consent obtained and signed  - see procedure note  - F/U 4 weeks, sooner prn  3. History of BRAO OS  - stable today  - at original visit--sector of retinal whitening superonasal macula and mild disc edema w/ tortuous vessels OS  - pt presented w/ scotoma OS and episodes of amaurosis fugax  - OCT showed paracentral acute middle maculopathy (PAMM) nasal to fovea corresponding to area of retinal whitening  - was admitted in September for stroke work up -- no positive findings upon work up but did start some medications to treat Afib and reduce stroke risk  - interestingly, VA OS has improved and returned to baseline acuity -- stable today  - discussed findings and prognosis  - pt has history of Afib and mitral regurg, followed by Dr. October  - ESR, CRP wnl, and stroke work up w/ carotid dopplers and echocardiogram -- negative  - F/U 4 weeks  4. Pseudophakia OU  - s/p CE/IOL (OS--[place]; OD--[place])  - beautiful surgery, doing well  - monitor  5,6. Hypertensive retinopathy OU - discussed importance of tight BP control - discussed relationship of BP to #1 and #2 above - monitor     Ophthalmic Meds Ordered this visit:  Meds ordered this encounter  Medications  . Bevacizumab (AVASTIN) SOLN 1.25 mg       Return in about 4 weeks (around 11/22/2018) for f/u BRVO OD, DFE, OCT.   There are no Patient Instructions on file for this visit.   Explained the diagnoses, plan, and follow up with the patient and they expressed understanding.  Patient expressed understanding of the importance of proper follow up care.   This document serves as a record of services personally performed by  12/05/2018  Antony Haste, MD, PhD. It was created on their behalf by Ernest Mallick, OA, an ophthalmic assistant. The creation of this record is the provider's dictation and/or activities during the visit.    Electronically signed by: Ernest Mallick, OA  02.24.2020 11:36 PM     Gardiner Sleeper, M.D., Ph.D. Diseases & Surgery of the Retina and Vitreous [facility]   I have reviewed the above documentation for accuracy and completeness, and I agree with the above. Gardiner Sleeper, M.D., Ph.D. 10/25/18 11:41 PM   Abbreviations: M myopia (nearsighted); A astigmatism; H hyperopia (farsighted); P presbyopia; Mrx spectacle prescription;  CTL contact lenses; OD right eye; OS left eye; OU both eyes  XT exotropia; ET esotropia; PEK punctate epithelial keratitis; PEE punctate epithelial erosions; DES dry eye syndrome; MGD meibomian gland dysfunction; ATs artificial tears; PFAT's preservative free artificial tears; [place] nuclear sclerotic cataract; PSC posterior subcapsular cataract; ERM epi-retinal membrane; PVD posterior vitreous detachment; RD retinal detachment; DM diabetes mellitus; DR diabetic retinopathy; NPDR non-proliferative diabetic retinopathy; PDR proliferative diabetic retinopathy; CSME clinically significant macular edema; DME diabetic macular edema; dbh dot blot hemorrhages; CWS cotton wool spot; POAG primary open angle glaucoma; C/D cup-to-disc ratio; HVF humphrey visual field; GVF goldmann visual field; OCT optical coherence tomography; IOP intraocular pressure; BRVO Branch retinal vein occlusion; CRVO central retinal vein occlusion; CRAO central retinal artery occlusion; BRAO branch retinal artery occlusion; RT retinal tear; SB scleral buckle; PPV pars plana vitrectomy; VH Vitreous hemorrhage; PRP panretinal laser photocoagulation; IVK intravitreal kenalog; VMT vitreomacular traction; MH Macular hole;  NVD neovascularization of the disc; NVE neovascularization elsewhere; AREDS age  related eye disease study; ARMD age related macular degeneration; POAG primary open angle glaucoma; EBMD epithelial/anterior basement membrane dystrophy; ACIOL anterior chamber intraocular lens; IOL intraocular lens; PCIOL posterior chamber intraocular lens; Phaco/IOL phacoemulsification with intraocular lens placement; [place] photorefractive keratectomy; LASIK laser assisted in situ keratomileusis; HTN hypertension; DM diabetes mellitus; COPD chronic obstructive pulmonary disease

## 2018-10-25 ENCOUNTER — Encounter (INDEPENDENT_AMBULATORY_CARE_PROVIDER_SITE_OTHER): Payer: Self-pay | Admitting: Ophthalmology

## 2018-10-25 ENCOUNTER — Ambulatory Visit (INDEPENDENT_AMBULATORY_CARE_PROVIDER_SITE_OTHER): Payer: Medicare Other | Admitting: Ophthalmology

## 2018-10-25 DIAGNOSIS — Z961 Presence of intraocular lens: Secondary | ICD-10-CM | POA: Diagnosis not present

## 2018-10-25 DIAGNOSIS — H3581 Retinal edema: Secondary | ICD-10-CM | POA: Diagnosis not present

## 2018-10-25 DIAGNOSIS — H34831 Tributary (branch) retinal vein occlusion, right eye, with macular edema: Secondary | ICD-10-CM

## 2018-10-25 DIAGNOSIS — H34232 Retinal artery branch occlusion, left eye: Secondary | ICD-10-CM

## 2018-10-25 DIAGNOSIS — I10 Essential (primary) hypertension: Secondary | ICD-10-CM

## 2018-10-25 DIAGNOSIS — H35033 Hypertensive retinopathy, bilateral: Secondary | ICD-10-CM

## 2018-10-25 MED ORDER — BEVACIZUMAB CHEMO INJECTION 1.25MG/0.05ML SYRINGE FOR KALEIDOSCOPE
1.2500 mg | INTRAVITREAL | Status: AC
  Administered 2018-10-25: 1.25 mg via INTRAVITREAL

## 2018-11-20 NOTE — Progress Notes (Signed)
[facility] Note  11/22/2018     CHIEF COMPLAINT Patient presents for Retina Follow Up   HISTORY OF PRESENT ILLNESS: Daniel Sizer Sr. is a 85 y.o. male who presents to the clinic today for:   HPI    Retina Follow Up    Patient presents with  CRVO/BRVO.  In right eye.  This started 4 weeks ago.  Severity is moderate.  Duration of 4 weeks.  Since onset it is gradually improving.  I, the attending physician,  performed the HPI with the patient and updated documentation appropriately.          Comments    Patient here for 4 weeks retina follow up for BRVO with mac edema OD. Patient states vision doing pretty good. No eye pain. OD doing better.       Last edited by Bernarda Caffey, MD on 11/22/2018  9:32 AM. (History)    pt states he had no problems after the first injection, he states his vision has improved also  Referring physician: Delilah Shan, MD [id number] [place], [place] [address]  HISTORICAL INFORMATION:   Selected notes from the MEDICAL RECORD NUMBER Referred by Dr. Sandre Kitty for concern of CRVO OS LEE: 09.19.19 Marigene Ehlers) [BCVA: OD: CF@1 ' OS: 20/30+2] Ocular Hx-cataract OD, pseudo OS, CRVO OS, HTN ret OU, Bell's Palsy (left side), visual field defect (localized, superior) PMH-heart disease, pacemaker    CURRENT MEDICATIONS: No current outpatient medications on file. (Ophthalmic Drugs)   No current facility-administered medications for this visit.  (Ophthalmic Drugs)   Current Outpatient Medications (Other)  Medication Sig  . apixaban (ELIQUIS) 5 MG TABS tablet Take 1 tablet (5 mg total) by mouth 2 (two) times daily.  Marland Kitchen atorvastatin (LIPITOR) 20 MG tablet Take 1 tablet (20 mg total) by mouth daily at 6 PM.  . doxycycline (VIBRAMYCIN) 100 MG capsule Take 1 capsule (100 mg total) by mouth 2 (two) times daily.  . metoprolol tartrate (LOPRESSOR) 25 MG tablet Take 25 mg by mouth 2 (two) times daily.   Current  Facility-Administered Medications (Other)  Medication Route  . Bevacizumab (AVASTIN) SOLN 1.25 mg Intravitreal      REVIEW OF SYSTEMS: ROS    Positive for: Skin, Musculoskeletal, HENT, Cardiovascular, Eyes   Negative for: Constitutional, Gastrointestinal, Neurological, Genitourinary, Endocrine, Respiratory, Psychiatric, Allergic/Imm, Heme/Lymph   Last edited by Theodore Demark on 11/22/2018  9:15 AM. (History)       ALLERGIES Allergies  Allergen Reactions  . Codeine Other (See Comments)  . Levaquin [Levofloxacin In D5w] Other (See Comments)    "muscles get weak"  . Phenergan [Promethazine Hcl] Other (See Comments)    PAST MEDICAL HISTORY Past Medical History:  Diagnosis Date  . Atrial fibrillation ([place])   . Cataract   . Scalp lesion    Past Surgical History:  Procedure Laterality Date  . CATARACT EXTRACTION Left 2008  . LESION REMOVAL N/A 02/12/2015   Procedure: EXCISION SCALP LESION    (MINOR ROOM) ;  Surgeon: Rozetta Nunnery, MD;  Location: [place];  Service: ENT;  Laterality: N/A;  Posterior scalp  . NECK SURGERY Left   . PROSTATE SURGERY      FAMILY HISTORY Family History  Problem Relation Age of Onset  . Glaucoma Sister   . Glaucoma Brother     SOCIAL HISTORY Social History   Tobacco Use  . Smoking status: Never Smoker  . Smokeless tobacco:  Never Used  Substance Use Topics  . Alcohol use: No  . Drug use: No         OPHTHALMIC EXAM:  Base Eye Exam    Visual Acuity (Snellen - Linear)      Right Left   Dist [place] 20/200 -1 20/30 +1   Dist ph [place] NI 20/25       Tonometry (Tonopen, 9:12 AM)      Right Left   Pressure 19 14       Pupils      Dark Light Shape React APD   Right 3 2  Sluggish Trace   Left 2 1 Round Sluggish None       Visual Fields (Counting fingers)      Left Right    Full    Restrictions  Total superior temporal deficiency       Extraocular Movement      Right Left    Full, Ortho Full, Ortho        Neuro/Psych    Oriented x3:  Yes   Mood/Affect:  Normal       Dilation    Both eyes:  1.0% Mydriacyl, 2.5% Phenylephrine @ 9:12 AM        Slit Lamp and Fundus Exam    Slit Lamp Exam      Right Left   Lids/Lashes Dermatochalasis - upper lid, Meibomian gland dysfunction Dermatochalasis - upper lid, Meibomian gland dysfunction, incomplete blink   Conjunctiva/Sclera mild nasal Pinguecula White and quiet   Cornea Arcus, Well healed cataract wounds with residual edema and corneal haze, 1+ Descemet's folds, 1+endopigment inferiorly Irregular epi inferiorly, 3-4+ Punctate epithelial erosions inferiorly   Anterior Chamber Deep and quiet Deep and quiet   Iris Round and poorly dilated to 47m, Transillumination defects 0230 0500 0630 0730 0900 Round and poorly dilated   Lens PC IOL in good position slightly shifted temporally and nasally PC IOL in good position   Vitreous Vitreous syneresis, Posterior vitreous detachment Vitreous syneresis, Posterior vitreous detachment       Fundus Exam      Right Left   Disc Mild pallor, mild Peripapillary atrophy, superior rim thinning Mild pallor, superior rim thinning   C/D Ratio 0.6 0.6   Macula Central CME -- improved, +exudates, +DBH, +telangectetic vessels Flat, blunted foveal reflex, No heme or edema   Vessels Vascular attenuation supeior greater than inferior, BRVO Vascular attenuation, Tortuous   Periphery attached, IRH along ST arcades and superiorly consistent with BRVO attached; scattered DBH          IMAGING AND PROCEDURES  Imaging and Procedures for @TODAY @  OCT, Retina - OU - Both Eyes       Right Eye Quality was good. Central Foveal Thickness: 430. Progression has improved. Findings include abnormal foveal contour, intraretinal fluid, no SRF, epiretinal membrane, inner retinal atrophy, outer retinal atrophy, cystoid macular edema (Interval improvement of IRF).   Left Eye Quality was good. Central Foveal Thickness: 258.  Progression has been stable. Findings include normal foveal contour, no IRF, no SRF (Focal thinning nasal macula).   Notes *Images captured and stored on drive  Diagnosis / Impression:  OD: Superior BRVO with CME - interval improvement in IRF OS: NFP with focal thinning nasal to fovea - remote BRAO/PAMM -- stable from prior  Clinical management:  See below  Abbreviations: NFP - Normal foveal profile. CME - cystoid macular edema. PED - pigment epithelial detachment. IRF - intraretinal fluid. SRF - subretinal fluid.  EZ - ellipsoid zone. ERM - epiretinal membrane. ORA - outer retinal atrophy. ORT - outer retinal tubulation. SRHM - subretinal hyper-reflective material         Intravitreal Injection, Pharmacologic Agent - OD - Right Eye       Time Out 11/22/2018. 10:10 AM. Confirmed correct patient, procedure, site, and patient consented.   Anesthesia Topical anesthesia was used. Anesthetic medications included Lidocaine 2%, Proparacaine 0.5%.   Procedure Preparation included 5% betadine to ocular surface, eyelid speculum. A supplied needle was used.   Injection:  1.25 mg Bevacizumab 1.31m/0.05ml   NDC: 50242-060-01, Lot: [id number] , Expiration date: 12/28/2018   Route: Intravitreal, Site: Right Eye, Waste: 0 mL  Post-op Post injection exam found visual acuity of at least counting fingers. The patient tolerated the procedure well. There were no complications. The patient received written and verbal post procedure care education.                 ASSESSMENT/PLAN:    ICD-10-CM   1. Branch retinal vein occlusion of right eye with macular edema H34.8310 Intravitreal Injection, Pharmacologic Agent - OD - Right Eye    Bevacizumab (AVASTIN) SOLN 1.25 mg  2. Retinal edema H35.81 OCT, Retina - OU - Both Eyes  3. 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 Pseudophakia of both eyes Z96.1   5. Essential hypertension I10   6. Hypertensive retinopathy of both eyes  H35.033   7. Mature cataract H26.8   8. Pseudophakia Z96.1     1,2. BRVO w/ macular edema OD  - s/p IVA OD #1 (02.25.20)  - BCVA 20/200  - OCT shows CME w/ interval improvement from prior  - recommend IVA OD #2 today, 03.23.20  - RBA of procedure discussed, questions answered  - informed consent obtained and signed  - see procedure note  - F/U 4 weeks, sooner prn  3. History of BRAO OS  - stable today  - at original visit--sector of retinal whitening superonasal macula and mild disc edema w/ tortuous vessels OS  - pt presented w/ scotoma OS and episodes of amaurosis fugax  - OCT showed paracentral acute middle maculopathy (PAMM) nasal to fovea corresponding to area of retinal whitening  - was admitted in September for stroke work up -- no positive findings upon work up but did start some medications to treat Afib and reduce stroke risk  - interestingly, VA OS has improved and returned to baseline acuity -- stable today  - discussed findings and prognosis  - pt has history of Afib and mitral regurg, followed by Dr. FOctober - ESR, CRP wnl, and stroke work up w/ carotid dopplers and echocardiogram -- negative  - F/U 4 weeks  4. Pseudophakia OU  - s/p CE/IOL (OS--[place]; OD--[place])  - beautiful surgeries, doing well  - monitor  5,6. Hypertensive retinopathy OU - discussed importance of tight BP control - discussed relationship of BP to #1 and #2 above - monitor     Ophthalmic Meds Ordered this visit:  Meds ordered this encounter  Medications  . Bevacizumab (AVASTIN) SOLN 1.25 mg       Return in about 4 weeks (around 12/20/2018) for f/u BRVO OD, DFE, OCT.   There are no Patient Instructions on file for this visit.   Explained the diagnoses, plan, and follow up with the patient and they expressed understanding.  Patient expressed understanding of the importance of proper follow up care.   This document serves as a  record of services personally performed by Gardiner Sleeper, MD, PhD. It was created on their behalf by Ernest Mallick, OA, an ophthalmic assistant. The creation of this record is the provider's dictation and/or activities during the visit.    Electronically signed by: Ernest Mallick, OA  03.22.2020 1:35 PM       Gardiner Sleeper, M.D., Ph.D. Diseases & Surgery of the Retina and Vitreous [facility]  I have reviewed the above documentation for accuracy and completeness, and I agree with the above. Gardiner Sleeper, M.D., Ph.D. 11/22/18 1:36 PM    Abbreviations: M myopia (nearsighted); A astigmatism; H hyperopia (farsighted); P presbyopia; Mrx spectacle prescription;  CTL contact lenses; OD right eye; OS left eye; OU both eyes  XT exotropia; ET esotropia; PEK punctate epithelial keratitis; PEE punctate epithelial erosions; DES dry eye syndrome; MGD meibomian gland dysfunction; ATs artificial tears; PFAT's preservative free artificial tears; [place] nuclear sclerotic cataract; PSC posterior subcapsular cataract; ERM epi-retinal membrane; PVD posterior vitreous detachment; RD retinal detachment; DM diabetes mellitus; DR diabetic retinopathy; NPDR non-proliferative diabetic retinopathy; PDR proliferative diabetic retinopathy; CSME clinically significant macular edema; DME diabetic macular edema; dbh dot blot hemorrhages; CWS cotton wool spot; POAG primary open angle glaucoma; C/D cup-to-disc ratio; HVF humphrey visual field; GVF goldmann visual field; OCT optical coherence tomography; IOP intraocular pressure; BRVO Branch retinal vein occlusion; CRVO central retinal vein occlusion; CRAO central retinal artery occlusion; BRAO branch retinal artery occlusion; RT retinal tear; SB scleral buckle; PPV pars plana vitrectomy; VH Vitreous hemorrhage; PRP panretinal laser photocoagulation; IVK intravitreal kenalog; VMT vitreomacular traction; MH Macular hole;  NVD neovascularization of the disc; NVE neovascularization elsewhere; AREDS age related eye  disease study; ARMD age related macular degeneration; POAG primary open angle glaucoma; EBMD epithelial/anterior basement membrane dystrophy; ACIOL anterior chamber intraocular lens; IOL intraocular lens; PCIOL posterior chamber intraocular lens; Phaco/IOL phacoemulsification with intraocular lens placement; [place] photorefractive keratectomy; LASIK laser assisted in situ keratomileusis; HTN hypertension; DM diabetes mellitus; COPD chronic obstructive pulmonary disease

## 2018-11-22 ENCOUNTER — Ambulatory Visit (INDEPENDENT_AMBULATORY_CARE_PROVIDER_SITE_OTHER): Payer: Medicare Other | Admitting: Ophthalmology

## 2018-11-22 ENCOUNTER — Encounter (INDEPENDENT_AMBULATORY_CARE_PROVIDER_SITE_OTHER): Payer: Medicare Other | Admitting: Ophthalmology

## 2018-11-22 ENCOUNTER — Other Ambulatory Visit: Payer: Self-pay

## 2018-11-22 ENCOUNTER — Encounter (INDEPENDENT_AMBULATORY_CARE_PROVIDER_SITE_OTHER): Payer: Self-pay | Admitting: Ophthalmology

## 2018-11-22 DIAGNOSIS — H34831 Tributary (branch) retinal vein occlusion, right eye, with macular edema: Secondary | ICD-10-CM

## 2018-11-22 DIAGNOSIS — H34232 Retinal artery branch occlusion, left eye: Secondary | ICD-10-CM | POA: Diagnosis not present

## 2018-11-22 DIAGNOSIS — Z961 Presence of intraocular lens: Secondary | ICD-10-CM | POA: Diagnosis not present

## 2018-11-22 DIAGNOSIS — H3581 Retinal edema: Secondary | ICD-10-CM | POA: Diagnosis not present

## 2018-11-22 DIAGNOSIS — H268 Other specified cataract: Secondary | ICD-10-CM

## 2018-11-22 DIAGNOSIS — I10 Essential (primary) hypertension: Secondary | ICD-10-CM

## 2018-11-22 DIAGNOSIS — H35033 Hypertensive retinopathy, bilateral: Secondary | ICD-10-CM

## 2018-11-22 MED ORDER — BEVACIZUMAB CHEMO INJECTION 1.25MG/0.05ML SYRINGE FOR KALEIDOSCOPE
1.2500 mg | INTRAVITREAL | Status: AC | PRN
  Administered 2018-11-22: 1.25 mg via INTRAVITREAL

## 2018-12-15 NOTE — Progress Notes (Signed)
[facility] Note  12/20/2018     CHIEF COMPLAINT Patient presents for Retina Follow Up   HISTORY OF PRESENT ILLNESS: Daniel Sizer Sr. is a 85 y.o. male who presents to the clinic today for:   HPI    Retina Follow Up    Patient presents with  CRVO/BRVO.  In right eye.  This started 2 months ago.  Severity is moderate.  Duration of 4 weeks.  Since onset it is gradually worsening.  I, the attending physician,  performed the HPI with the patient and updated documentation appropriately.          Comments    85 y/o male pt here for 4 wk f/u for BRVO w/mac edema OD.  Feels VA OD may be a little better.  No change in [place] OS.  Denies pain, flashes, floaters.  AT prn OU.       Last edited by Bernarda Caffey, MD on 12/20/2018 10:22 AM. (History)    pt states he feels like his vision is the same as last time, he states he is unable to tell that his right eye is worse than his left  Referring physician: Delilah Shan, MD [id number] [place], [place] [address]  HISTORICAL INFORMATION:   Selected notes from the MEDICAL RECORD NUMBER Referred by Dr. Sandre Kitty for concern of CRVO OS LEE: 09.19.19 Marigene Ehlers) [BCVA: OD: CF_0 ' OS: 20/30+2] Ocular Hx-cataract OD, pseudo OS, CRVO OS, HTN ret OU, Bell's Palsy (left side), visual field defect (localized, superior) PMH-heart disease, pacemaker    CURRENT MEDICATIONS: No current outpatient medications on file. (Ophthalmic Drugs)   No current facility-administered medications for this visit.  (Ophthalmic Drugs)   Current Outpatient Medications (Other)  Medication Sig  . apixaban (ELIQUIS) 5 MG TABS tablet Take 1 tablet (5 mg total) by mouth 2 (two) times daily.  Marland Kitchen aspirin EC 325 MG tablet Take by mouth.  Marland Kitchen atorvastatin (LIPITOR) 20 MG tablet Take 1 tablet (20 mg total) by mouth daily at 6 PM.  . doxycycline (VIBRAMYCIN) 100 MG capsule Take 1 capsule (100 mg total) by mouth 2 (two) times daily.  .  magnesium (MAGTAB) 84 MG (7MEQ) TBCR SR tablet Take by mouth.  . metoprolol tartrate (LOPRESSOR) 25 MG tablet Take 25 mg by mouth 2 (two) times daily.   Current Facility-Administered Medications (Other)  Medication Route  . Bevacizumab (AVASTIN) SOLN 1.25 mg Intravitreal      REVIEW OF SYSTEMS: ROS    Positive for: Cardiovascular, Eyes   Negative for: Constitutional, Gastrointestinal, Neurological, Skin, Genitourinary, Musculoskeletal, HENT, Endocrine, Respiratory, Psychiatric, Allergic/Imm, Heme/Lymph   Last edited by Matthew Folks, COA on 12/20/2018 10:01 AM. (History)       ALLERGIES Allergies  Allergen Reactions  . Codeine Other (See Comments)  . Levaquin [Levofloxacin In D5w] Other (See Comments)    "muscles get weak"  . Phenergan [Promethazine Hcl] Other (See Comments)    PAST MEDICAL HISTORY Past Medical History:  Diagnosis Date  . Atrial fibrillation ([place])   . Cataract   . Hypertensive retinopathy    OU  . Scalp lesion    Past Surgical History:  Procedure Laterality Date  . CATARACT EXTRACTION Left 2008  . EYE SURGERY    . LESION REMOVAL N/A 02/12/2015   Procedure: EXCISION SCALP LESION    (MINOR ROOM) ;  Surgeon: Rozetta Nunnery, MD;  Location: [facility];  Service: ENT;  Laterality:  N/A;  Posterior scalp  . NECK SURGERY Left   . PROSTATE SURGERY      FAMILY HISTORY Family History  Problem Relation Age of Onset  . Glaucoma Sister   . Glaucoma Brother     SOCIAL HISTORY Social History   Tobacco Use  . Smoking status: Never Smoker  . Smokeless tobacco: Never Used  Substance Use Topics  . Alcohol use: No  . Drug use: No         OPHTHALMIC EXAM:  Base Eye Exam    Visual Acuity (Snellen - Linear)      Right Left   Dist [place] 20/200 -2 20/25 -2   Dist ph [place] NI NI       Tonometry (Tonopen, 10:04 AM)      Right Left   Pressure 20 14       Pupils      Dark Light Shape React APD   Right 3 2 Round Sluggish Trace    Left 2 1 Round Sluggish None       Visual Fields (Counting fingers)      Left Right    Full    Restrictions  Total superior temporal deficiency       Extraocular Movement      Right Left    Full, Ortho Full, Ortho       Neuro/Psych    Oriented x3:  Yes   Mood/Affect:  Normal       Dilation    Both eyes:  1.0% Mydriacyl, 2.5% Phenylephrine @ 10:04 AM        Slit Lamp and Fundus Exam    Slit Lamp Exam      Right Left   Lids/Lashes Dermatochalasis - upper lid, Meibomian gland dysfunction Dermatochalasis - upper lid, Meibomian gland dysfunction, incomplete blink   Conjunctiva/Sclera mild nasal Pinguecula White and quiet   Cornea Arcus, Well healed cataract wounds with residual edema and corneal haze, 1+ Descemet's folds, 1+endopigment inferiorly Irregular epi inferiorly, 3-4+ Punctate epithelial erosions inferiorly   Anterior Chamber Deep and quiet Deep and quiet   Iris Round and poorly dilated to 4m, Transillumination defects 0230 0500 0630 0730 0900 Round and poorly dilated   Lens PC IOL in good position slightly shifted temporally and nasally PC IOL in good position   Vitreous Vitreous syneresis, Posterior vitreous detachment Vitreous syneresis, Posterior vitreous detachment       Fundus Exam      Right Left   Disc Mild pallor, mild Peripapillary atrophy, superior rim thinning Mild pallor, superior rim thinning   C/D Ratio 0.6 0.6   Macula Central CME -- mildly improved, +exudates, +DBH, +telangectetic vessels superiorly Flat, blunted foveal reflex, No heme or edema   Vessels Vascular attenuation supeior greater than inferior, BRVO Vascular attenuation, Tortuous   Periphery attached, IRH along ST arcades and superiorly consistent with BRVO -- interval improvement attached; scattered DBH          IMAGING AND PROCEDURES  Imaging and Procedures for _0 @  OCT, Retina - OU - Both Eyes       Right Eye Quality was good. Central Foveal Thickness: 419. Progression  has improved. Findings include abnormal foveal contour, intraretinal fluid, no SRF, epiretinal membrane, inner retinal atrophy, outer retinal atrophy, cystoid macular edema, intraretinal hyper-reflective material, subretinal hyper-reflective material (Mild interval improvement of IRF).   Left Eye Quality was good. Central Foveal Thickness: 260. Progression has been stable. Findings include normal foveal contour, no IRF, no SRF (Focal thinning  nasal macula).   Notes *Images captured and stored on drive  Diagnosis / Impression:  OD: Superior BRVO with CME - mild interval improvement in IRF OS: NFP with focal thinning nasal to fovea - remote BRAO/PAMM -- stable from prior  Clinical management:  See below  Abbreviations: NFP - Normal foveal profile. CME - cystoid macular edema. PED - pigment epithelial detachment. IRF - intraretinal fluid. SRF - subretinal fluid. EZ - ellipsoid zone. ERM - epiretinal membrane. ORA - outer retinal atrophy. ORT - outer retinal tubulation. SRHM - subretinal hyper-reflective material         Intravitreal Injection, Pharmacologic Agent - OD - Right Eye       Time Out 12/20/2018. 10:55 AM. Confirmed correct patient, procedure, site, and patient consented.   Anesthesia Topical anesthesia was used. Anesthetic medications included Lidocaine 2%, Proparacaine 0.5%.   Procedure Preparation included 5% betadine to ocular surface, eyelid speculum. A supplied needle was used.   Injection:  1.25 mg Bevacizumab (AVASTIN) SOLN   NDC: 18403-754-36, Lot: [id number] , Expiration date: 01/18/2019   Route: Intravitreal, Site: Right Eye, Waste: 0 mL  Post-op Post injection exam found visual acuity of at least counting fingers. The patient tolerated the procedure well. There were no complications. The patient received written and verbal post procedure care education.                 ASSESSMENT/PLAN:    ICD-10-CM   1. Branch retinal vein occlusion of right eye  with macular edema H34.8310 Intravitreal Injection, Pharmacologic Agent - OD - Right Eye    Bevacizumab (AVASTIN) SOLN 1.25 mg  2. Retinal edema H35.81 OCT, Retina - OU - Both Eyes  3. 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 Pseudophakia of both eyes Z96.1   5. Essential hypertension I10   6. Hypertensive retinopathy of both eyes H35.033     1,2. BRVO w/ macular edema OD  - s/p IVA OD #1 (02.25.20), #2 (03.23.20)  - BCVA 20/200-2  - OCT shows CME w/ interval improvement from prior  - recommend IVA OD #3 today, 04.21.20  - RBA of procedure discussed, questions answered  - informed consent obtained and signed  - see procedure note  - F/U 4 weeks, sooner prn  3. History of BRAO OS  - stable today  - at original visit--sector of retinal whitening superonasal macula and mild disc edema w/ tortuous vessels OS  - pt presented w/ scotoma OS and episodes of amaurosis fugax  - OCT showed paracentral acute middle maculopathy (PAMM) nasal to fovea corresponding to area of retinal whitening  - was admitted in September 2019 for stroke work up -- no positive findings upon work up but did start some medications to treat Afib and reduce stroke risk  - interestingly, VA OS has improved and returned to baseline acuity (20/25) -- stable today  - discussed findings and prognosis  - pt has history of Afib and mitral regurg, followed by Dr. Otho Perl  - ESR, CRP wnl, and stroke work up w/ carotid dopplers and echocardiogram -- negative  - monitor  4. Pseudophakia OU  - s/p CE/IOL (OS--[place]; OD--[place])  - beautiful surgeries, doing well  - monitor  5,6. Hypertensive retinopathy OU  - discussed importance of tight BP control  - discussed relationship of BP to #1 and #2 above  - monitor     Ophthalmic Meds Ordered this visit:  Meds ordered this encounter  Medications  . Bevacizumab (AVASTIN) SOLN  1.25 mg       Return in about 4 weeks (around 01/17/2019) for f/u BRVO, DFE,  OCT.   There are no Patient Instructions on file for this visit.   Explained the diagnoses, plan, and follow up with the patient and they expressed understanding.  Patient expressed understanding of the importance of proper follow up care.   This document serves as a record of services personally performed by Gardiner Sleeper, MD, PhD. It was created on their behalf by Ernest Mallick, OA, an ophthalmic assistant. The creation of this record is the provider's dictation and/or activities during the visit.    Electronically signed by: Ernest Mallick, OA  04.16.2020 11:20 AM    Gardiner Sleeper, M.D., Ph.D. Diseases & Surgery of the Retina and Vitreous [facility]   I have reviewed the above documentation for accuracy and completeness, and I agree with the above. Gardiner Sleeper, M.D., Ph.D. 12/20/18 11:31 AM    Abbreviations: M myopia (nearsighted); A astigmatism; H hyperopia (farsighted); P presbyopia; Mrx spectacle prescription;  CTL contact lenses; OD right eye; OS left eye; OU both eyes  XT exotropia; ET esotropia; PEK punctate epithelial keratitis; PEE punctate epithelial erosions; DES dry eye syndrome; MGD meibomian gland dysfunction; ATs artificial tears; PFAT's preservative free artificial tears; [place] nuclear sclerotic cataract; PSC posterior subcapsular cataract; ERM epi-retinal membrane; PVD posterior vitreous detachment; RD retinal detachment; DM diabetes mellitus; DR diabetic retinopathy; NPDR non-proliferative diabetic retinopathy; PDR proliferative diabetic retinopathy; CSME clinically significant macular edema; DME diabetic macular edema; dbh dot blot hemorrhages; CWS cotton wool spot; POAG primary open angle glaucoma; C/D cup-to-disc ratio; HVF humphrey visual field; GVF goldmann visual field; OCT optical coherence tomography; IOP intraocular pressure; BRVO Branch retinal vein occlusion; CRVO central retinal vein occlusion; CRAO central retinal artery occlusion;  BRAO branch retinal artery occlusion; RT retinal tear; SB scleral buckle; PPV pars plana vitrectomy; VH Vitreous hemorrhage; PRP panretinal laser photocoagulation; IVK intravitreal kenalog; VMT vitreomacular traction; MH Macular hole;  NVD neovascularization of the disc; NVE neovascularization elsewhere; AREDS age related eye disease study; ARMD age related macular degeneration; POAG primary open angle glaucoma; EBMD epithelial/anterior basement membrane dystrophy; ACIOL anterior chamber intraocular lens; IOL intraocular lens; PCIOL posterior chamber intraocular lens; Phaco/IOL phacoemulsification with intraocular lens placement; [place] photorefractive keratectomy; LASIK laser assisted in situ keratomileusis; HTN hypertension; DM diabetes mellitus; COPD chronic obstructive pulmonary disease

## 2018-12-20 ENCOUNTER — Ambulatory Visit (INDEPENDENT_AMBULATORY_CARE_PROVIDER_SITE_OTHER): Payer: Medicare Other | Admitting: Ophthalmology

## 2018-12-20 ENCOUNTER — Encounter (INDEPENDENT_AMBULATORY_CARE_PROVIDER_SITE_OTHER): Payer: Self-pay | Admitting: Ophthalmology

## 2018-12-20 ENCOUNTER — Other Ambulatory Visit: Payer: Self-pay

## 2018-12-20 DIAGNOSIS — H34232 Retinal artery branch occlusion, left eye: Secondary | ICD-10-CM | POA: Diagnosis not present

## 2018-12-20 DIAGNOSIS — Z961 Presence of intraocular lens: Secondary | ICD-10-CM | POA: Diagnosis not present

## 2018-12-20 DIAGNOSIS — H34831 Tributary (branch) retinal vein occlusion, right eye, with macular edema: Secondary | ICD-10-CM

## 2018-12-20 DIAGNOSIS — H3581 Retinal edema: Secondary | ICD-10-CM | POA: Diagnosis not present

## 2018-12-20 DIAGNOSIS — I10 Essential (primary) hypertension: Secondary | ICD-10-CM

## 2018-12-20 DIAGNOSIS — H35033 Hypertensive retinopathy, bilateral: Secondary | ICD-10-CM

## 2018-12-20 MED ORDER — BEVACIZUMAB CHEMO INJECTION 1.25MG/0.05ML SYRINGE FOR KALEIDOSCOPE
1.2500 mg | INTRAVITREAL | Status: AC | PRN
  Administered 2018-12-20: 1.25 mg via INTRAVITREAL

## 2019-01-16 NOTE — Progress Notes (Signed)
[facility] Note  01/17/2019     CHIEF COMPLAINT Patient presents for Retina Follow Up   HISTORY OF PRESENT ILLNESS: Steele Sizer Sr. is a 85 y.o. male who presents to the clinic today for:   HPI    Retina Follow Up    Patient presents with  CRVO/BRVO.  In right eye.  This started 3 months ago.  Severity is moderate.  Duration of 4 weeks.  Since onset it is stable.  I, the attending physician,  performed the HPI with the patient and updated documentation appropriately.          Comments    85 y/o male pt here for 4 wk f/u for BRVO OD.  No change in [place] OU.  Denies pain, flashes, floaters.  No gtts.       Last edited by Bernarda Caffey, MD on 01/17/2019 10:12 AM. (History)    pt states he feels like his vision is doing pretty well   Referring physician: Delilah Shan, MD [id number] [place], [place] [address]  HISTORICAL INFORMATION:   Selected notes from the MEDICAL RECORD NUMBER Referred by Dr. Sandre Kitty for concern of CRVO OS LEE: 09.19.19 Marigene Ehlers) [BCVA: OD: CF@1 ' OS: 20/30+2] Ocular Hx-cataract OD, pseudo OS, CRVO OS, HTN ret OU, Bell's Palsy (left side), visual field defect (localized, superior) PMH-heart disease, pacemaker    CURRENT MEDICATIONS: No current outpatient medications on file. (Ophthalmic Drugs)   No current facility-administered medications for this visit.  (Ophthalmic Drugs)   Current Outpatient Medications (Other)  Medication Sig  . apixaban (ELIQUIS) 5 MG TABS tablet Take 1 tablet (5 mg total) by mouth 2 (two) times daily.  Marland Kitchen aspirin EC 325 MG tablet Take by mouth.  Marland Kitchen atorvastatin (LIPITOR) 20 MG tablet Take 1 tablet (20 mg total) by mouth daily at 6 PM.  . doxycycline (VIBRAMYCIN) 100 MG capsule Take 1 capsule (100 mg total) by mouth 2 (two) times daily.  . magnesium (MAGTAB) 84 MG (7MEQ) TBCR SR tablet Take by mouth.  . metoprolol tartrate (LOPRESSOR) 25 MG tablet Take 25 mg by mouth 2 (two) times  daily.   Current Facility-Administered Medications (Other)  Medication Route  . Bevacizumab (AVASTIN) SOLN 1.25 mg Intravitreal      REVIEW OF SYSTEMS: ROS    Positive for: Cardiovascular, Eyes   Negative for: Constitutional, Gastrointestinal, Neurological, Skin, Genitourinary, Musculoskeletal, HENT, Endocrine, Respiratory, Psychiatric, Allergic/Imm, Heme/Lymph   Last edited by Matthew Folks, COA on 01/17/2019  9:12 AM. (History)       ALLERGIES Allergies  Allergen Reactions  . Codeine Other (See Comments)  . Levaquin [Levofloxacin In D5w] Other (See Comments)    "muscles get weak"  . Phenergan [Promethazine Hcl] Other (See Comments)    PAST MEDICAL HISTORY Past Medical History:  Diagnosis Date  . Atrial fibrillation ([place])   . Hypertensive retinopathy    OU  . Scalp lesion    Past Surgical History:  Procedure Laterality Date  . CATARACT EXTRACTION Bilateral   . EYE SURGERY    . LESION REMOVAL N/A 02/12/2015   Procedure: EXCISION SCALP LESION    (MINOR ROOM) ;  Surgeon: Rozetta Nunnery, MD;  Location: [facility];  Service: ENT;  Laterality: N/A;  Posterior scalp  . NECK SURGERY Left   . PROSTATE SURGERY      FAMILY HISTORY Family History  Problem Relation Age of Onset  . Glaucoma Sister   .  Glaucoma Brother     SOCIAL HISTORY Social History   Tobacco Use  . Smoking status: Never Smoker  . Smokeless tobacco: Never Used  Substance Use Topics  . Alcohol use: No  . Drug use: No         OPHTHALMIC EXAM:  Base Eye Exam    Visual Acuity (Snellen - Linear)      Right Left   Dist [place] 20/250 20/25 -2   Dist ph [place] NI NI       Tonometry (Tonopen, 9:16 AM)      Right Left   Pressure 18 16       Pupils      Dark Light Shape React APD   Right 3 2 Round Sluggish Trace   Left 2 1 Round Sluggish None       Visual Fields (Counting fingers)      Left Right    Full    Restrictions  Total superior temporal deficiency        Extraocular Movement      Right Left    Full, Ortho Full, Ortho       Neuro/Psych    Oriented x3:  Yes   Mood/Affect:  Normal       Dilation    Both eyes:  1.0% Mydriacyl, 2.5% Phenylephrine @ 9:16 AM        Slit Lamp and Fundus Exam    Slit Lamp Exam      Right Left   Lids/Lashes Dermatochalasis - upper lid, Meibomian gland dysfunction Dermatochalasis - upper lid, Meibomian gland dysfunction, incomplete blink   Conjunctiva/Sclera mild nasal Pinguecula White and quiet   Cornea Arcus, Well healed cataract wounds with residual edema and corneal haze, 1+ Descemet's folds, 1+endopigment inferiorly Irregular epi inferiorly, 3-4+ Punctate epithelial erosions inferiorly   Anterior Chamber Deep and quiet Deep and quiet   Iris Round and poorly dilated to 23m, Transillumination defects 0230 0500 0630 0730 0900 Round and poorly dilated   Lens PC IOL in good position slightly shifted temporally and nasally PC IOL in good position   Vitreous Vitreous syneresis, Posterior vitreous detachment Vitreous syneresis, Posterior vitreous detachment       Fundus Exam      Right Left   Disc Mild pallor, mild Peripapillary atrophy, superior rim thinning Mild pallor, superior rim thinning   C/D Ratio 0.6 0.6   Macula Central CME -- persistent, exudates, DBH and telangectetic vessels - improving moderate peripapillary blot heme at 1000 Flat, blunted foveal reflex, No heme or edema   Vessels Vascular attenuation supeior greater than inferior, BRVO Vascular attenuation, Tortuous   Periphery attached, superior DBH improving attached; scattered DBH          IMAGING AND PROCEDURES  Imaging and Procedures for @TODAY @  OCT, Retina - OU - Both Eyes       Right Eye Quality was good. Central Foveal Thickness: 482. Progression has been stable. Findings include abnormal foveal contour, intraretinal fluid, no SRF, epiretinal membrane, inner retinal atrophy, outer retinal atrophy, cystoid macular edema,  intraretinal hyper-reflective material, subretinal hyper-reflective material (?interval increase in IRF).   Left Eye Quality was good. Central Foveal Thickness: 259. Progression has been stable. Findings include normal foveal contour, no IRF, no SRF (Focal thinning nasal macula).   Notes *Images captured and stored on drive  Diagnosis / Impression:  OD: Superior BRVO with CME - ?Interval increase in IRF OS: NFP with focal thinning nasal to fovea - remote BRAO/PAMM -- stable  from prior  Clinical management:  See below  Abbreviations: NFP - Normal foveal profile. CME - cystoid macular edema. PED - pigment epithelial detachment. IRF - intraretinal fluid. SRF - subretinal fluid. EZ - ellipsoid zone. ERM - epiretinal membrane. ORA - outer retinal atrophy. ORT - outer retinal tubulation. SRHM - subretinal hyper-reflective material         Intravitreal Injection, Pharmacologic Agent - OD - Right Eye       Time Out 01/17/2019. 10:45 AM. Confirmed correct patient, procedure, site, and patient consented.   Anesthesia Topical anesthesia was used. Anesthetic medications included Lidocaine 2%, Proparacaine 0.5%.   Procedure Preparation included 5% betadine to ocular surface, eyelid speculum. A 30 gauge needle was used.   Injection:  2 mg aflibercept Alfonse Flavors) SOLN   NDC: 38882-800-34, Lot: [id number], Expiration date: 08/30/2019   Route: Intravitreal, Site: Right Eye, Waste: 0.05 mL  Post-op Post injection exam found visual acuity of at least counting fingers. The patient tolerated the procedure well. There were no complications. The patient received written and verbal post procedure care education.                 ASSESSMENT/PLAN:    ICD-10-CM   1. Branch retinal vein occlusion of right eye with macular edema H34.8310 Intravitreal Injection, Pharmacologic Agent - OD - Right Eye  2. Retinal edema H35.81 OCT, Retina - OU - Both Eyes  3. 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 Pseudophakia of both eyes Z96.1   5. Essential hypertension I10   6. Hypertensive retinopathy of both eyes H35.033   7. Mature cataract H26.8   8. Pseudophakia Z96.1     1,2. BRVO w/ macular edema OD  - s/p IVA OD #1 (02.25.20), #2 (03.23.20), #3 (04.21.20)  - BCVA 20/250  - OCT shows persistent CME w/ minimal improvement if any  - discussed RBA of switching intravitreal therapies  - recommend switching to Eylea OD #1 today, 05.19.20 due to persistent IRF -- will give sample vial of Eylea  - pt wishes to proceed  - RBA of procedure discussed, questions answered  - informed consent obtained and signed  - see procedure note  - Eylea4U benefits investigation started 05.18.20  - F/U 5 weeks, sooner prn  3. History of BRAO OS  - stable today  - at original visit--sector of retinal whitening superonasal macula and mild disc edema w/ tortuous vessels OS  - pt presented w/ scotoma OS and episodes of amaurosis fugax  - OCT showed paracentral acute middle maculopathy (PAMM) nasal to fovea corresponding to area of retinal whitening  - was admitted in September 2019 for stroke work up -- no positive findings upon work up but did start some medications to treat Afib and reduce stroke risk  - interestingly, VA OS has improved and returned to baseline acuity (20/25) -- stable today  - discussed findings and prognosis  - pt has history of Afib and mitral regurg, followed by Dr. Otho Perl  - ESR, CRP wnl, and stroke work up w/ carotid dopplers and echocardiogram -- negative  - monitor  4. Pseudophakia OU  - s/p CE/IOL (OS--[place]; OD--[place])  - beautiful surgeries, doing well  - monitor  5,6. Hypertensive retinopathy OU  - discussed importance of tight BP control  - discussed relationship of BP to #1 and #2 above  - monitor     Ophthalmic Meds Ordered this visit:  No orders of the defined types were placed in this  encounter.      Return in about 5 weeks (around  02/21/2019) for f/u BRVO OD, DFE, OCT.   There are no Patient Instructions on file for this visit.   Explained the diagnoses, plan, and follow up with the patient and they expressed understanding.  Patient expressed understanding of the importance of proper follow up care.   This document serves as a record of services personally performed by Gardiner Sleeper, MD, PhD. It was created on their behalf by Ernest Mallick, OA, an ophthalmic assistant. The creation of this record is the provider's dictation and/or activities during the visit.    Electronically signed by: Ernest Mallick, OA  05.18.2020 4:48 PM    Gardiner Sleeper, M.D., Ph.D. Diseases & Surgery of the Retina and Vitreous [facility]  I have reviewed the above documentation for accuracy and completeness, and I agree with the above. Gardiner Sleeper, M.D., Ph.D. 01/17/19 4:54 PM    Abbreviations: M myopia (nearsighted); A astigmatism; H hyperopia (farsighted); P presbyopia; Mrx spectacle prescription;  CTL contact lenses; OD right eye; OS left eye; OU both eyes  XT exotropia; ET esotropia; PEK punctate epithelial keratitis; PEE punctate epithelial erosions; DES dry eye syndrome; MGD meibomian gland dysfunction; ATs artificial tears; PFAT's preservative free artificial tears; [place] nuclear sclerotic cataract; PSC posterior subcapsular cataract; ERM epi-retinal membrane; PVD posterior vitreous detachment; RD retinal detachment; DM diabetes mellitus; DR diabetic retinopathy; NPDR non-proliferative diabetic retinopathy; PDR proliferative diabetic retinopathy; CSME clinically significant macular edema; DME diabetic macular edema; dbh dot blot hemorrhages; CWS cotton wool spot; POAG primary open angle glaucoma; C/D cup-to-disc ratio; HVF humphrey visual field; GVF goldmann visual field; OCT optical coherence tomography; IOP intraocular pressure; BRVO Branch retinal vein occlusion; CRVO central retinal vein occlusion; CRAO central  retinal artery occlusion; BRAO branch retinal artery occlusion; RT retinal tear; SB scleral buckle; PPV pars plana vitrectomy; VH Vitreous hemorrhage; PRP panretinal laser photocoagulation; IVK intravitreal kenalog; VMT vitreomacular traction; MH Macular hole;  NVD neovascularization of the disc; NVE neovascularization elsewhere; AREDS age related eye disease study; ARMD age related macular degeneration; POAG primary open angle glaucoma; EBMD epithelial/anterior basement membrane dystrophy; ACIOL anterior chamber intraocular lens; IOL intraocular lens; PCIOL posterior chamber intraocular lens; Phaco/IOL phacoemulsification with intraocular lens placement; [place] photorefractive keratectomy; LASIK laser assisted in situ keratomileusis; HTN hypertension; DM diabetes mellitus; COPD chronic obstructive pulmonary disease

## 2019-01-17 ENCOUNTER — Encounter (INDEPENDENT_AMBULATORY_CARE_PROVIDER_SITE_OTHER): Payer: Self-pay | Admitting: Ophthalmology

## 2019-01-17 ENCOUNTER — Other Ambulatory Visit: Payer: Self-pay

## 2019-01-17 ENCOUNTER — Ambulatory Visit (INDEPENDENT_AMBULATORY_CARE_PROVIDER_SITE_OTHER): Payer: Medicare Other | Admitting: Ophthalmology

## 2019-01-17 DIAGNOSIS — H34232 Retinal artery branch occlusion, left eye: Secondary | ICD-10-CM | POA: Diagnosis not present

## 2019-01-17 DIAGNOSIS — I10 Essential (primary) hypertension: Secondary | ICD-10-CM

## 2019-01-17 DIAGNOSIS — H34831 Tributary (branch) retinal vein occlusion, right eye, with macular edema: Secondary | ICD-10-CM

## 2019-01-17 DIAGNOSIS — Z961 Presence of intraocular lens: Secondary | ICD-10-CM | POA: Diagnosis not present

## 2019-01-17 DIAGNOSIS — H3581 Retinal edema: Secondary | ICD-10-CM

## 2019-01-17 DIAGNOSIS — H35033 Hypertensive retinopathy, bilateral: Secondary | ICD-10-CM

## 2019-01-17 MED ORDER — AFLIBERCEPT 2MG/0.05ML IZ SOLN FOR KALEIDOSCOPE
2.0000 mg | INTRAVITREAL | Status: AC | PRN
  Administered 2019-01-17: 2 mg via INTRAVITREAL

## 2019-02-22 ENCOUNTER — Encounter (INDEPENDENT_AMBULATORY_CARE_PROVIDER_SITE_OTHER): Payer: Medicare Other | Admitting: Ophthalmology

## 2019-02-27 ENCOUNTER — Encounter (INDEPENDENT_AMBULATORY_CARE_PROVIDER_SITE_OTHER): Payer: Medicare Other | Admitting: Ophthalmology

## 2019-02-27 NOTE — Progress Notes (Signed)
[facility] Note  02/28/2019     CHIEF COMPLAINT Patient presents for Retina Follow Up   HISTORY OF PRESENT ILLNESS: Daniel Sizer Sr. is a 85 y.o. male who presents to the clinic today for:   HPI    Retina Follow Up    Patient presents with  CRVO/BRVO.  In right eye.  This started 6 weeks ago.  Severity is moderate.  I, the attending physician,  performed the HPI with the patient and updated documentation appropriately.          Comments    Patient here for 6 week retina follow up for BRVO OD. Patient states vision doing fine. No eye pain. Dr added Eliquis 5 mg X 2 a day in May.       Last edited by Bernarda Caffey, MD on 02/28/2019  4:13 PM. (History)    pt states he feels like his vision is improving, he states he has not had any problems with the injections, he states he is on Eliquis again   Referring physician: Delilah Shan, MD [id number] [place],   [address]  HISTORICAL INFORMATION:   Selected notes from the MEDICAL RECORD NUMBER Referred by Dr. Sandre Kitty for concern of CRVO OS LEE: 09.19.19 Marigene Ehlers) [BCVA: OD: CF@1 ' OS: 20/30+2] Ocular Hx-cataract OD, pseudo OS, CRVO OS, HTN ret OU, Bell's Palsy (left side), visual field defect (localized, superior) PMH-heart disease, pacemaker    CURRENT MEDICATIONS: No current outpatient medications on file. (Ophthalmic Drugs)   No current facility-administered medications for this visit.  (Ophthalmic Drugs)   Current Outpatient Medications (Other)  Medication Sig  . apixaban (ELIQUIS) 5 MG TABS tablet Take 1 tablet (5 mg total) by mouth 2 (two) times daily.  Marland Kitchen aspirin EC 325 MG tablet Take by mouth.  Marland Kitchen atorvastatin (LIPITOR) 20 MG tablet Take 1 tablet (20 mg total) by mouth daily at 6 PM.  . doxycycline (VIBRAMYCIN) 100 MG capsule Take 1 capsule (100 mg total) by mouth 2 (two) times daily.  . magnesium (MAGTAB) 84 MG (7MEQ) TBCR SR tablet Take by mouth.  . metoprolol  tartrate (LOPRESSOR) 25 MG tablet Take 25 mg by mouth 2 (two) times daily.   Current Facility-Administered Medications (Other)  Medication Route  . Bevacizumab (AVASTIN) SOLN 1.25 mg Intravitreal      REVIEW OF SYSTEMS: ROS    Positive for: Skin, Musculoskeletal, HENT, Cardiovascular, Eyes   Negative for: Constitutional, Gastrointestinal, Neurological, Genitourinary, Endocrine, Respiratory, Psychiatric, Allergic/Imm, Heme/Lymph   Last edited by Theodore Demark on 02/28/2019  2:57 PM. (History)       ALLERGIES Allergies  Allergen Reactions  . Codeine Other (See Comments)  . Levaquin [Levofloxacin In D5w] Other (See Comments)    "muscles get weak"  . Phenergan [Promethazine Hcl] Other (See Comments)    PAST MEDICAL HISTORY Past Medical History:  Diagnosis Date  . Atrial fibrillation ([place])   . Hypertensive retinopathy    OU  . Scalp lesion    Past Surgical History:  Procedure Laterality Date  . CATARACT EXTRACTION Bilateral   . EYE SURGERY    . LESION REMOVAL N/A 02/12/2015   Procedure: EXCISION SCALP LESION    (MINOR ROOM) ;  Surgeon: Rozetta Nunnery, MD;  Location: [place];  Service: ENT;  Laterality: N/A;  Posterior scalp  . NECK SURGERY Left   . PROSTATE SURGERY      FAMILY HISTORY Family History  Problem Relation Age of Onset  . Glaucoma Sister   . Glaucoma Brother     SOCIAL HISTORY Social History   Tobacco Use  . Smoking status: Never Smoker  . Smokeless tobacco: Never Used  Substance Use Topics  . Alcohol use: No  . Drug use: No         OPHTHALMIC EXAM:  Base Eye Exam    Visual Acuity (Snellen - Linear)      Right Left   Dist [place] 20/200 -1 20/30 -2   Dist ph [place] 20/150 -2 20/25 -2       Tonometry (Tonopen, 2:54 PM)      Right Left   Pressure 20 17       Pupils      Dark Light Shape React APD   Right 3 2 Round Sluggish Trace   Left 2 1 Round Sluggish None       Visual Fields (Counting fingers)      Left  Right    Full    Restrictions  Total superior temporal deficiency       Extraocular Movement      Right Left    Full, Ortho Full, Ortho       Neuro/Psych    Oriented x3: Yes   Mood/Affect: Normal       Dilation    Both eyes: 1.0% Mydriacyl, 2.5% Phenylephrine @ 2:54 PM        Slit Lamp and Fundus Exam    Slit Lamp Exam      Right Left   Lids/Lashes Dermatochalasis - upper lid, Meibomian gland dysfunction Dermatochalasis - upper lid, Meibomian gland dysfunction, incomplete blink   Conjunctiva/Sclera mild nasal Pinguecula White and quiet   Cornea Arcus, Well healed cataract wounds with residual edema and corneal haze, 1+ Descemet's folds, 1+endopigment inferiorly Irregular epi inferiorly, 3-4+ Punctate epithelial erosions inferiorly   Anterior Chamber Deep and quiet Deep and quiet   Iris Round and poorly dilated to 81m, Transillumination defects 0230 0500 0630 0730 0900 Round and poorly dilated   Lens PC IOL in good position slightly shifted temporally and nasally PC IOL in good position   Vitreous Vitreous syneresis, Posterior vitreous detachment Vitreous syneresis, Posterior vitreous detachment       Fundus Exam      Right Left   Disc Mild pallor, mild Peripapillary atrophy, superior rim thinning Mild pallor, superior rim thinning   C/D Ratio 0.6 0.6   Macula Central CME -- improved, exudates, DBH and telangectetic vessels - improving Flat, blunted foveal reflex, No heme or edema   Vessels Vascular attenuation superior greater than inferior, BRVO Vascular attenuation, Tortuous   Periphery attached, superior DBH improving attached; scattered DBH          IMAGING AND PROCEDURES  Imaging and Procedures for @TODAY @  OCT, Retina - OU - Both Eyes       Right Eye Quality was good. Central Foveal Thickness: 234. Progression has improved. Findings include abnormal foveal contour, intraretinal fluid, no SRF, epiretinal membrane, inner retinal atrophy, outer retinal atrophy,  cystoid macular edema, intraretinal hyper-reflective material, subretinal hyper-reflective material (Interval improvement in IRF).   Left Eye Quality was good. Central Foveal Thickness: 261. Progression has been stable. Findings include normal foveal contour, no IRF, no SRF (Focal thinning nasal macula).   Notes *Images captured and stored on drive  Diagnosis / Impression:  OD: Superior BRVO with CME - Interval improvement in IRF OS: NFP with focal thinning nasal to fovea -  remote BRAO/PAMM -- stable from prior  Clinical management:  See below  Abbreviations: NFP - Normal foveal profile. CME - cystoid macular edema. PED - pigment epithelial detachment. IRF - intraretinal fluid. SRF - subretinal fluid. EZ - ellipsoid zone. ERM - epiretinal membrane. ORA - outer retinal atrophy. ORT - outer retinal tubulation. SRHM - subretinal hyper-reflective material         Intravitreal Injection, Pharmacologic Agent - OD - Right Eye       Time Out 02/28/2019. Confirmed correct patient, procedure, site, and patient consented.   Anesthesia Topical anesthesia was used. Anesthetic medications included Lidocaine 2%, Proparacaine 0.5%.   Procedure Preparation included 5% betadine to ocular surface, eyelid speculum. A 30 gauge needle was used.   Injection:  2 mg aflibercept Alfonse Flavors) SOLN   NDC: [id number], Lot: [id number], Expiration date: 10/21/2019   Route: Intravitreal, Site: Right Eye, Waste: 0.05 mL  Post-op Post injection exam found visual acuity of at least counting fingers. The patient tolerated the procedure well. There were no complications. The patient received written and verbal post procedure care education.                 ASSESSMENT/PLAN:    ICD-10-CM   1. Branch retinal vein occlusion of right eye with macular edema  H34.8310 Intravitreal Injection, Pharmacologic Agent - OD - Right Eye  2. Retinal edema  H35.81 OCT, Retina - OU - Both Eyes  3. 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 Pseudophakia of both eyes  Z96.1   5. Essential hypertension  I10   6. Hypertensive retinopathy of both eyes  H35.033   7. Mature cataract  H26.8   8. Pseudophakia  Z96.1     1,2. BRVO w/ macular edema OD  - s/p IVA OD #1 (02.25.20), #2 (03.23.20), #3 (04.21.20) - limited improvement on IVA  - s/p IVE OD #1 (05.19.20) - sample  - BCVA 20/150 from 20/250  - OCT shows interval improvement in IRF  - recommend Eylea OD #2 today, 06.30.20 due to persistent IRF   - pt wishes to proceed  - RBA of procedure discussed, questions answered  - informed consent obtained and signed  - see procedure note  - [email or web address] benefits investigation started 05.18.20 -- approved as of 6.30.2020 visit  - F/U 6 weeks, sooner prn  3. History of BRAO OS  - stable today  - at original visit--sector of retinal whitening superonasal macula and mild disc edema w/ tortuous vessels OS  - pt presented w/ scotoma OS and episodes of amaurosis fugax  - OCT showed paracentral acute middle maculopathy (PAMM) nasal to fovea corresponding to area of retinal whitening  - was admitted in September 2019 for stroke work up -- no positive findings upon work up but did start some medications to treat Afib and reduce stroke risk  - interestingly, VA OS has improved and returned to baseline acuity (20/25) -- stable today  - discussed findings and prognosis  - pt has history of Afib and mitral regurg, followed by Dr. Otho Perl  - ESR, CRP wnl, and stroke work up w/ carotid dopplers and echocardiogram -- negative  - monitor  4. Pseudophakia OU  - s/p CE/IOL (OS--[place]; OD--[place])  - beautiful surgeries, doing well  - monitor  5,6. Hypertensive retinopathy OU  - discussed importance of tight BP control  - discussed relationship of BP to #1 and #2 above  - monitor   Ophthalmic Meds Ordered this visit:  No orders of the defined types were placed in this encounter.      Return in about 6  weeks (around 04/11/2019) for f/u BRVO OD, DFE, OCT.   There are no Patient Instructions on file for this visit.   Explained the diagnoses, plan, and follow up with the patient and they expressed understanding.  Patient expressed understanding of the importance of proper follow up care.   This document serves as a record of services personally performed by Gardiner Sleeper, MD, PhD. It was created on their behalf by Ernest Mallick, OA, an ophthalmic assistant. The creation of this record is the provider's dictation and/or activities during the visit.    Electronically signed by: Ernest Mallick, OA 06.29.2020 5:08 PM   Gardiner Sleeper, M.D., Ph.D. Diseases & Surgery of the Retina and Vitreous [facility]  I have reviewed the above documentation for accuracy and completeness, and I agree with the above. Gardiner Sleeper, M.D., Ph.D. 02/28/19 5:11 PM    Abbreviations: M myopia (nearsighted); A astigmatism; H hyperopia (farsighted); P presbyopia; Mrx spectacle prescription;  CTL contact lenses; OD right eye; OS left eye; OU both eyes  XT exotropia; ET esotropia; PEK punctate epithelial keratitis; PEE punctate epithelial erosions; DES dry eye syndrome; MGD meibomian gland dysfunction; ATs artificial tears; PFAT's preservative free artificial tears; [place] nuclear sclerotic cataract; PSC posterior subcapsular cataract; ERM epi-retinal membrane; PVD posterior vitreous detachment; RD retinal detachment; DM diabetes mellitus; DR diabetic retinopathy; NPDR non-proliferative diabetic retinopathy; PDR proliferative diabetic retinopathy; CSME clinically significant macular edema; DME diabetic macular edema; dbh dot blot hemorrhages; CWS cotton wool spot; POAG primary open angle glaucoma; C/D cup-to-disc ratio; HVF humphrey visual field; GVF goldmann visual field; OCT optical coherence tomography; IOP intraocular pressure; BRVO Branch retinal vein occlusion; CRVO central retinal vein occlusion;  CRAO central retinal artery occlusion; BRAO branch retinal artery occlusion; RT retinal tear; SB scleral buckle; PPV pars plana vitrectomy; VH Vitreous hemorrhage; PRP panretinal laser photocoagulation; IVK intravitreal kenalog; VMT vitreomacular traction; MH Macular hole;  NVD neovascularization of the disc; NVE neovascularization elsewhere; AREDS age related eye disease study; ARMD age related macular degeneration; POAG primary open angle glaucoma; EBMD epithelial/anterior basement membrane dystrophy; ACIOL anterior chamber intraocular lens; IOL intraocular lens; PCIOL posterior chamber intraocular lens; Phaco/IOL phacoemulsification with intraocular lens placement; [place] photorefractive keratectomy; LASIK laser assisted in situ keratomileusis; HTN hypertension; DM diabetes mellitus; COPD chronic obstructive pulmonary disease

## 2019-02-28 ENCOUNTER — Encounter (INDEPENDENT_AMBULATORY_CARE_PROVIDER_SITE_OTHER): Payer: Self-pay | Admitting: Ophthalmology

## 2019-02-28 ENCOUNTER — Ambulatory Visit (INDEPENDENT_AMBULATORY_CARE_PROVIDER_SITE_OTHER): Payer: Medicare Other | Admitting: Ophthalmology

## 2019-02-28 ENCOUNTER — Other Ambulatory Visit: Payer: Self-pay

## 2019-02-28 DIAGNOSIS — H34831 Tributary (branch) retinal vein occlusion, right eye, with macular edema: Secondary | ICD-10-CM

## 2019-02-28 DIAGNOSIS — H3581 Retinal edema: Secondary | ICD-10-CM | POA: Diagnosis not present

## 2019-02-28 DIAGNOSIS — Z961 Presence of intraocular lens: Secondary | ICD-10-CM

## 2019-02-28 DIAGNOSIS — I10 Essential (primary) hypertension: Secondary | ICD-10-CM

## 2019-02-28 DIAGNOSIS — H34232 Retinal artery branch occlusion, left eye: Secondary | ICD-10-CM

## 2019-02-28 DIAGNOSIS — H268 Other specified cataract: Secondary | ICD-10-CM

## 2019-02-28 DIAGNOSIS — H35033 Hypertensive retinopathy, bilateral: Secondary | ICD-10-CM

## 2019-02-28 MED ORDER — AFLIBERCEPT 2MG/0.05ML IZ SOLN FOR KALEIDOSCOPE
2.0000 mg | INTRAVITREAL | Status: AC | PRN
  Administered 2019-02-28: 2 mg via INTRAVITREAL

## 2019-04-01 DEATH — deceased

## 2019-04-11 ENCOUNTER — Encounter (INDEPENDENT_AMBULATORY_CARE_PROVIDER_SITE_OTHER): Payer: Medicare Other | Admitting: Ophthalmology

## 2019-06-29 IMAGING — MR MR HEAD W/O CM
9 of 11 series · 31 of 48 positions shown · non-contrast
Comparison: MR brain 06/03/2015.  MR brain 03/04/2017.

CLINICAL DATA: History states patient presents to the ED from his
ophthalmologist who could not see behind the eye due to swelling.
Patient has floating objects. Concern for papilledema. Visual
difficulty.

EXAM:
MRI HEAD WITHOUT CONTRAST
MRA HEAD WITHOUT CONTRAST
TECHNIQUE: Multiplanar, multiecho pulse sequences of the brain and surrounding
structures were obtained without intravenous contrast. Angiographic
images of the head were obtained using MRA technique without
contrast.

[Series 3: DWI · axial · 3.0mm · 0.94mm/px · z∈[-102,+40]mm · 6 of 99 slices shown (1 of 2)]
[im 1/99]
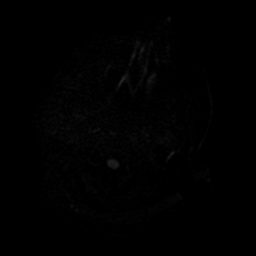
[im 20/99]
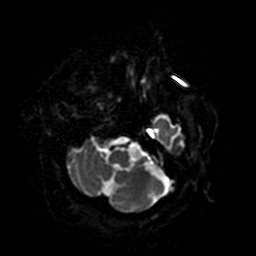
[im 40/99]
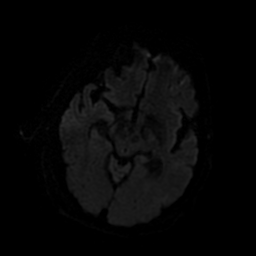
[im 59/99]
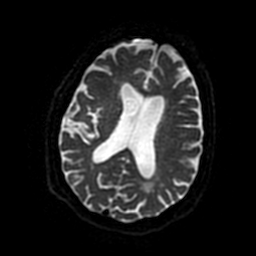
[im 79/99]
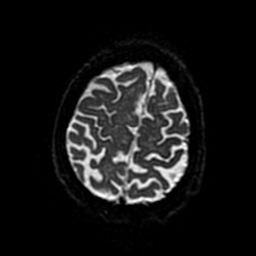
[im 99/99]
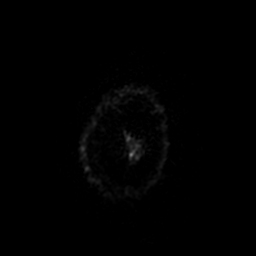

[Series 4: ax (id) 2 · axial · 1.0mm · 0.43mm/px · z∈[-68,-8]mm · 6 of 184 slices shown]
[im 1/184]
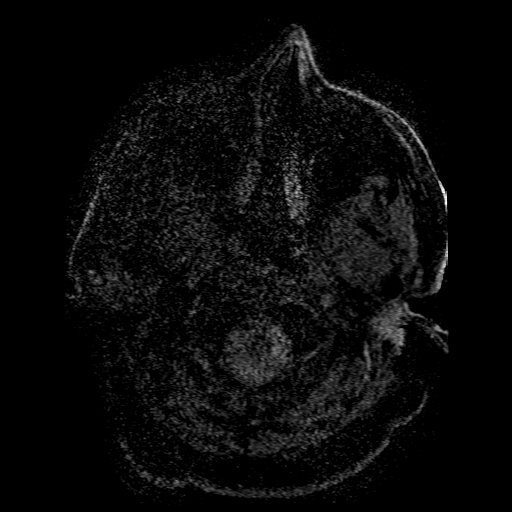
[im 31/184]
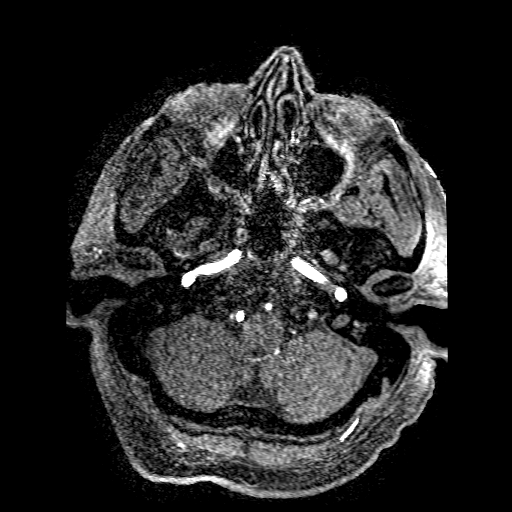
[im 62/184]
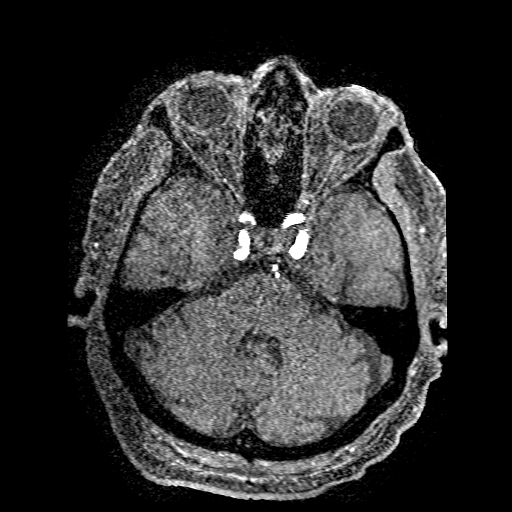
[im 77/184]
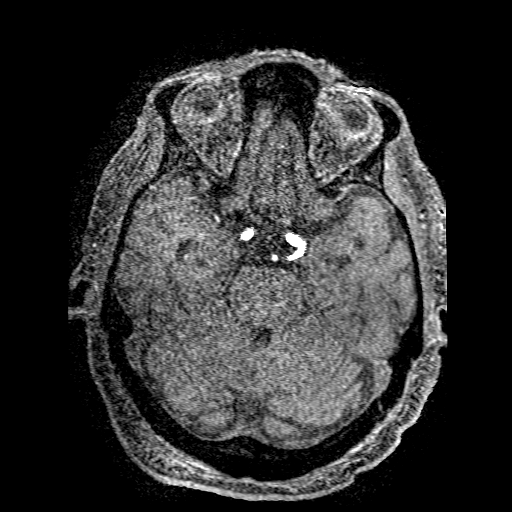
[im 107/184]
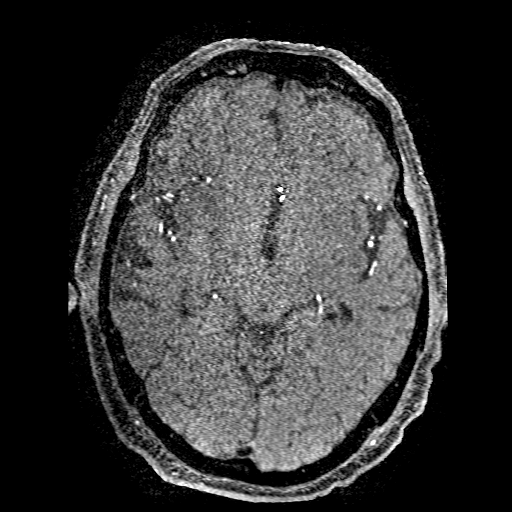
[im 123/184]
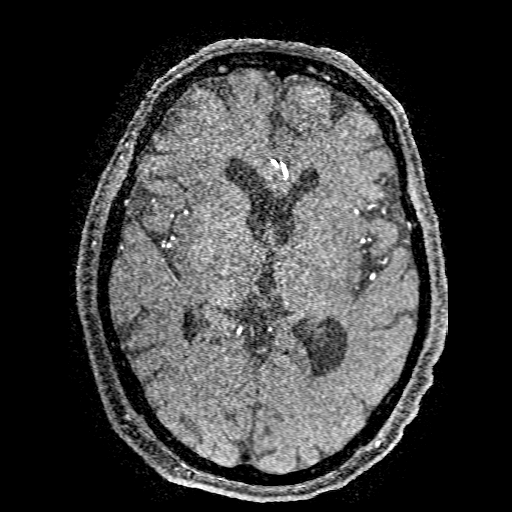

[Series 5: DWI · coronal · 4.0mm · 0.94mm/px · 5 of 70 slices shown (2 of 2)]
[im 1/70]
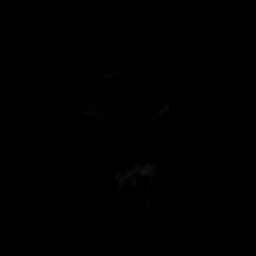
[im 18/70]
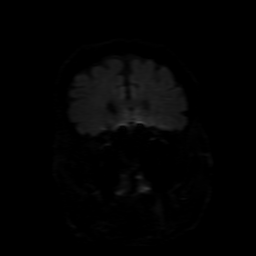
[im 35/70]
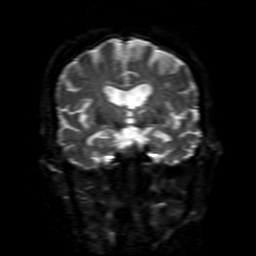
[im 52/70]
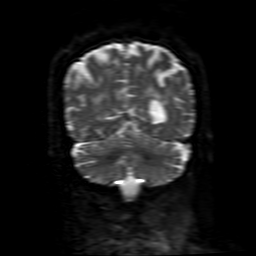
[im 70/70]
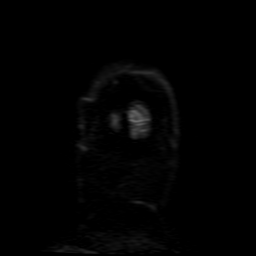

[Series 6: FLAIR · sagittal · 5.0mm · 0.47mm/px · 2 of 23 slices shown (1 of 2)]
[im 1/23]
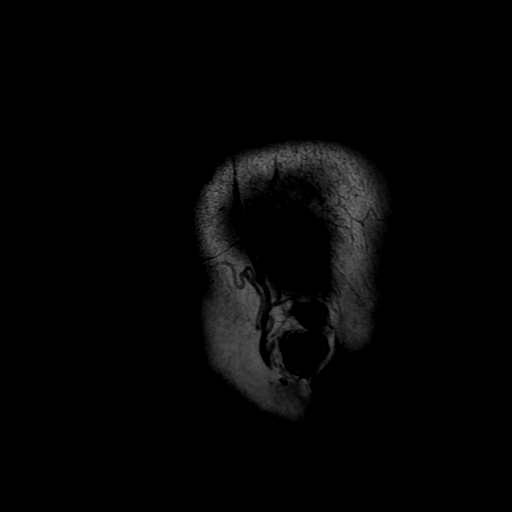
[im 23/23]
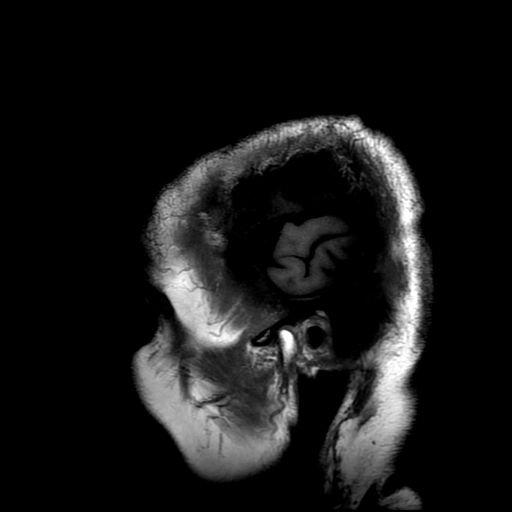

[Series 8: T2 · axial · 5.0mm · 0.47mm/px · z∈[-100,+39]mm · 2 of 25 slices shown (1 of 2)]
[im 1/25]
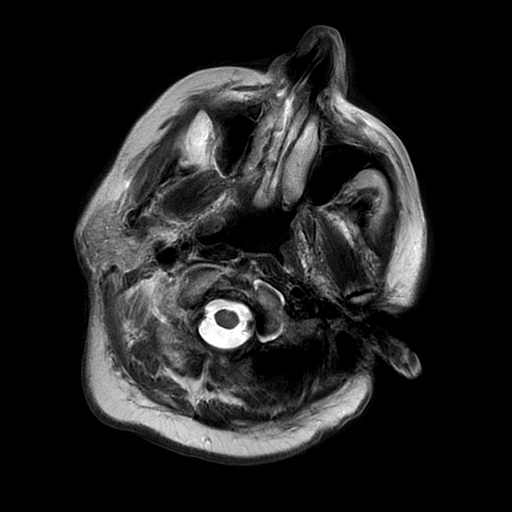
[im 25/25]
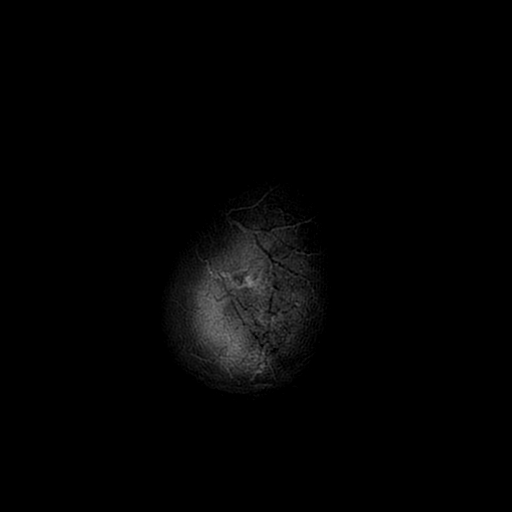

[Series 9: FLAIR · axial · 5.0mm · 0.47mm/px · z∈[-100,+39]mm · 2 of 25 slices shown (2 of 2)]
[im 1/25]
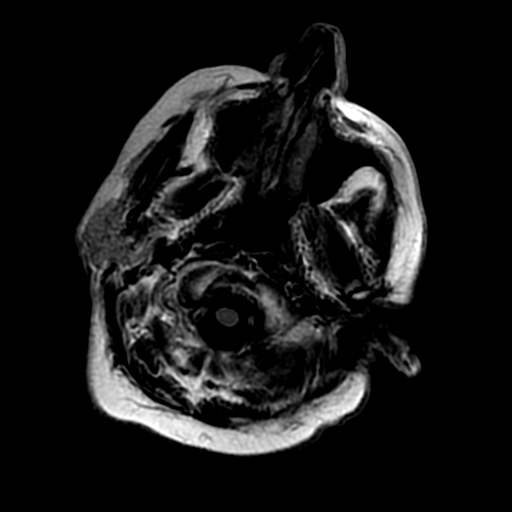
[im 25/25]
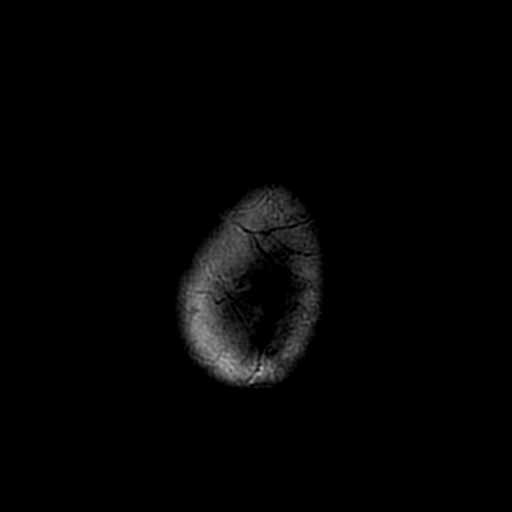

[Series 12: T2 · coronal · 5.0mm · 0.94mm/px · 2 of 30 slices shown (2 of 2)]
[im 1/30]
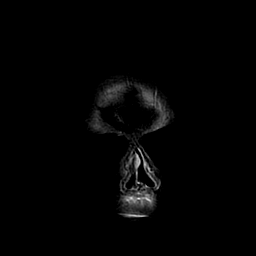
[im 30/30]
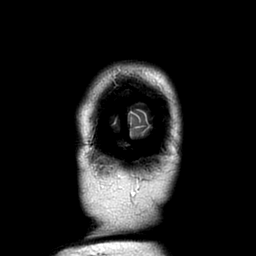

[Series 350: ADC · axial · 3.0mm · 0.94mm/px · z∈[-102,+40]mm · 3 of 50 slices shown (1 of 2)]
[im 1/50]
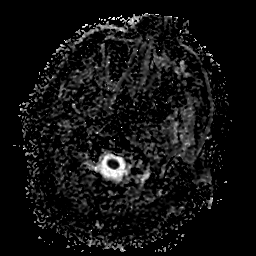
[im 25/50]
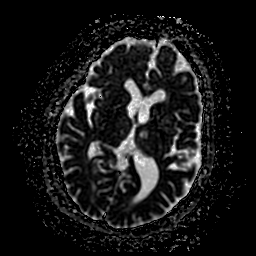
[im 50/50]
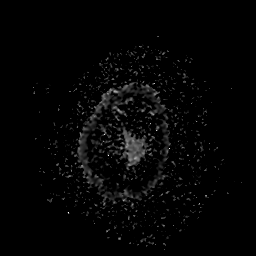

[Series 550: ADC · coronal · 4.0mm · 0.94mm/px · 3 of 36 slices shown (2 of 2)]
[im 1/36]
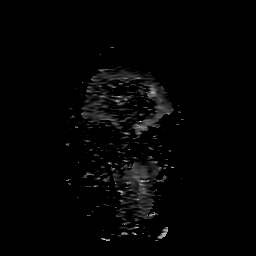
[im 18/36]
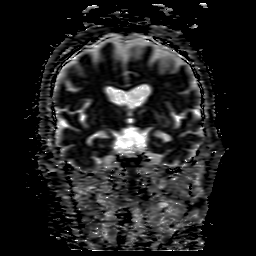
[im 36/36]
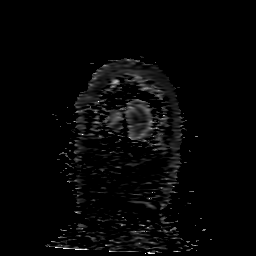

[31 of 48 positions shown; findings below may reference images not displayed]

FINDINGS: MRI HEAD FINDINGS

Brain: No acute stroke, hemorrhage, mass lesion, or extra-axial
fluid. Generalized atrophy. Ventriculomegaly, likely hydrocephalus
ex vacuo. T2 and FLAIR hyperintensities throughout the white matter,
likely small vessel disease.

Vascular: Described separately under MRA head.

Skull and upper cervical spine: Normal marrow signal

Sinuses/Orbits: Sinuses are clear. LEFT cataract extraction. No
orbital masses. Patulous optic nerve sheaths, as evaluated on this
non-dedicated orbital exam, appear to represent BILATERAL optic
atrophy. No secondary signs of papilledema.

Other: None

MRA HEAD FINDINGS

Both internal carotid arteries are widely patent but dolichoectatic.
The basilar artery is patent, mildly hypoplastic due to fetal origin
LEFT PCA. RIGHT vertebral dominant. No proximal M1 or M2 MCA
stenosis. No visible MCA branch occlusion. Both anterior cerebral
arteries are patent and contribute equally to the ACA circulation.

There is an abnormal wide necked outpouching from the LEFT internal
carotid artery at the posterior communicating artery origin. It is
approximately 4 x 4 mm, too large to classify as an infundibulum,
and is consistent with a saccular aneurysm; the posterior
communicating artery originates directly the apex of this aneurysm,
and becomes a fetal PCA. There is no irregularity or diverticulum to
suggest recent hemorrhage.
IMPRESSION: MRI brain demonstrating no acute intracranial findings. Atrophy and
small vessel disease.

MRA intracranial demonstrating an unruptured atypical 4 mm saccular
aneurysm, LEFT posterior communicating artery origin, from which the
PCom originates at its apex and continues as a fetal LEFT PCA.
Continued surveillance is warranted.
